# Patient Record
Sex: Male | Born: 1950 | Race: Black or African American | Hispanic: No | Marital: Single | Smoking: Former smoker
Health system: Southern US, Community
[De-identification: ages and names within clinical notes are randomized; demographics above are authoritative.]

## PROBLEM LIST (undated history)

## (undated) DIAGNOSIS — I10 Essential (primary) hypertension: Secondary | ICD-10-CM

## (undated) DIAGNOSIS — B159 Hepatitis A without hepatic coma: Secondary | ICD-10-CM

## (undated) DIAGNOSIS — C801 Malignant (primary) neoplasm, unspecified: Secondary | ICD-10-CM

## (undated) DIAGNOSIS — R519 Headache, unspecified: Secondary | ICD-10-CM

---
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
## 2008-07-26 DIAGNOSIS — I639 Cerebral infarction, unspecified: Secondary | ICD-10-CM

## 2008-07-26 HISTORY — DX: Cerebral infarction, unspecified: I63.9

## 2014-03-20 DIAGNOSIS — I10 Essential (primary) hypertension: Secondary | ICD-10-CM | POA: Insufficient documentation

## 2014-05-08 ENCOUNTER — Emergency Department: Payer: Self-pay | Admitting: Emergency Medicine

## 2014-05-08 LAB — CBC WITH DIFFERENTIAL/PLATELET
Basophil #: 0.1 10*3/uL (ref 0.0–0.1)
Basophil %: 1.3 %
Eosinophil #: 0.1 10*3/uL (ref 0.0–0.7)
Eosinophil %: 1.6 %
HCT: 45.2 % (ref 40.0–52.0)
HGB: 14.7 g/dL (ref 13.0–18.0)
Lymphocyte #: 1.8 10*3/uL (ref 1.0–3.6)
Lymphocyte %: 24.6 %
MCH: 32.1 pg (ref 26.0–34.0)
MCHC: 32.6 g/dL (ref 32.0–36.0)
MCV: 99 fL (ref 80–100)
Monocyte #: 0.5 x10 3/mm (ref 0.2–1.0)
Monocyte %: 6.8 %
Neutrophil #: 4.8 10*3/uL (ref 1.4–6.5)
Neutrophil %: 65.7 %
Platelet: 119 10*3/uL — ABNORMAL LOW (ref 150–440)
RBC: 4.58 10*6/uL (ref 4.40–5.90)
RDW: 14.6 % — ABNORMAL HIGH (ref 11.5–14.5)
WBC: 7.3 10*3/uL (ref 3.8–10.6)

## 2014-05-08 LAB — COMPREHENSIVE METABOLIC PANEL
Albumin: 3.2 g/dL — ABNORMAL LOW (ref 3.4–5.0)
Alkaline Phosphatase: 91 U/L
Anion Gap: 7 (ref 7–16)
BUN: 14 mg/dL (ref 7–18)
Bilirubin,Total: 1.3 mg/dL — ABNORMAL HIGH (ref 0.2–1.0)
Calcium, Total: 8.4 mg/dL — ABNORMAL LOW (ref 8.5–10.1)
Chloride: 104 mmol/L (ref 98–107)
Co2: 25 mmol/L (ref 21–32)
Creatinine: 1.12 mg/dL (ref 0.60–1.30)
EGFR (African American): >60
EGFR (Non-African Amer.): >60
Glucose: 89 mg/dL (ref 65–99)
Osmolality: 272 (ref 275–301)
Potassium: 4 mmol/L (ref 3.5–5.1)
SGOT(AST): 116 U/L — ABNORMAL HIGH (ref 15–37)
SGPT (ALT): 137 U/L — ABNORMAL HIGH
Sodium: 136 mmol/L (ref 136–145)
Total Protein: 8.2 g/dL (ref 6.4–8.2)

## 2014-05-08 LAB — LIPASE, BLOOD: Lipase: 100 U/L (ref 73–393)

## 2014-05-08 LAB — URINALYSIS, COMPLETE
Bacteria: NONE SEEN
Bilirubin,UR: NEGATIVE
Blood: NEGATIVE
Glucose,UR: NEGATIVE mg/dL (ref 0–75)
Ketone: NEGATIVE
Leukocyte Esterase: NEGATIVE
Nitrite: NEGATIVE
Ph: 6 (ref 4.5–8.0)
Protein: NEGATIVE
RBC,UR: NONE SEEN /HPF (ref 0–5)
Specific Gravity: 1.013 (ref 1.003–1.030)
Squamous Epithelial: NONE SEEN
WBC UR: <1 /HPF (ref 0–5)

## 2014-05-08 IMAGING — CT CT ABD-PELV W/ CM
2 of 5 series · 15 of 46 positions shown, 17 images · IV contrast (agent unspecified)
Comparison: None.

CLINICAL DATA: Right lower quadrant pain from yesterday, possible
appendicitis, nausea

EXAM:
CT ABDOMEN AND PELVIS WITH CONTRAST
TECHNIQUE: Multidetector CT imaging of the abdomen and pelvis was performed
using the standard protocol following bolus administration of
intravenous contrast.
CONTRAST:  100 cc [LK]

[Series 2: routine abd pel with · axial · 0.70mm/px · z∈[-932,-527]mm · 12 of 91 slices shown, 14 images]
[im 5/91  soft-tissue]
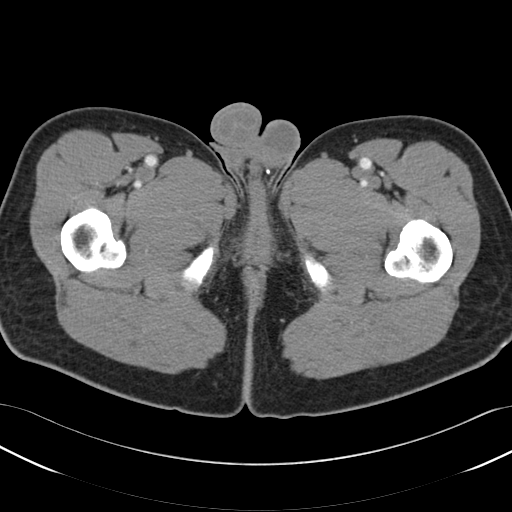
[im 5/91  bone]
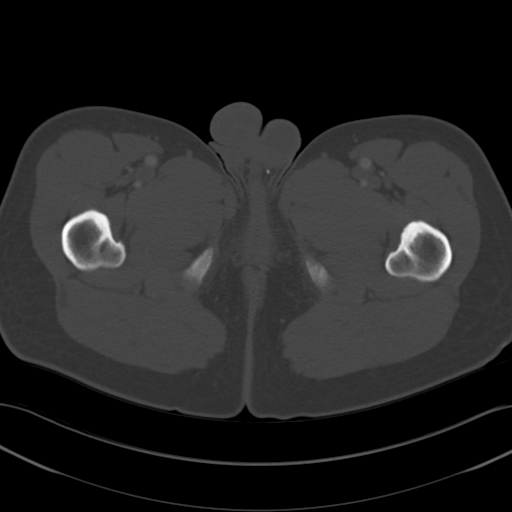
[im 14/91  soft-tissue]
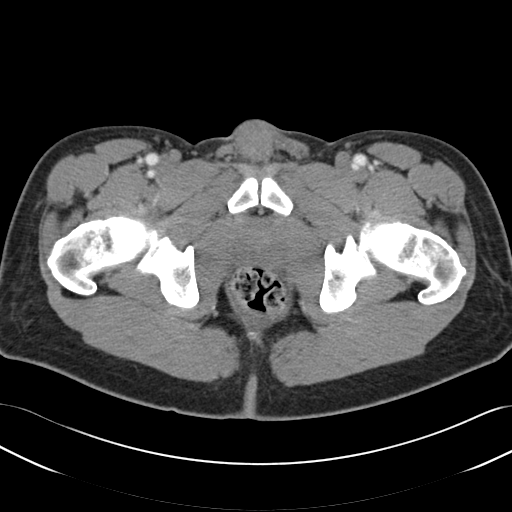
[im 19/91  soft-tissue]
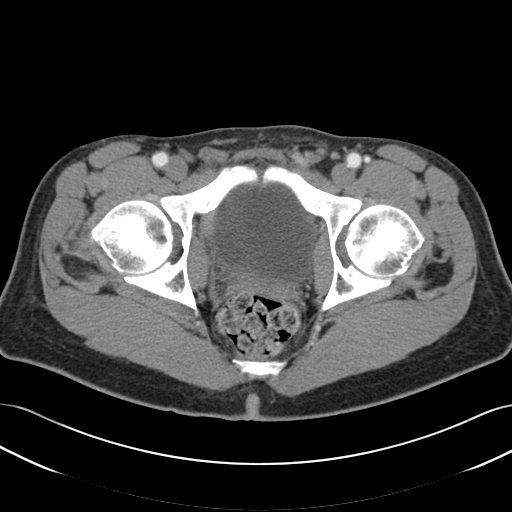
[im 28/91  soft-tissue]
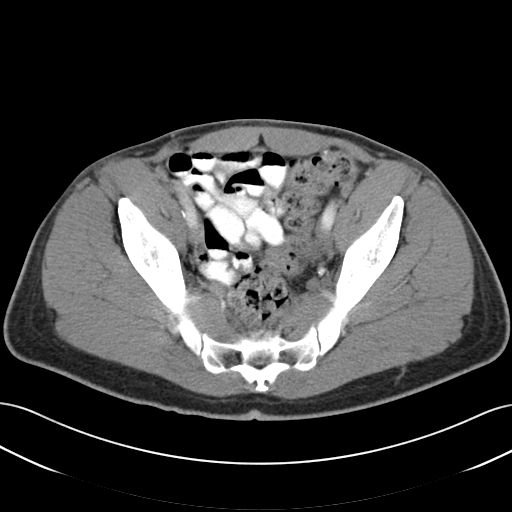
[im 37/91  soft-tissue]
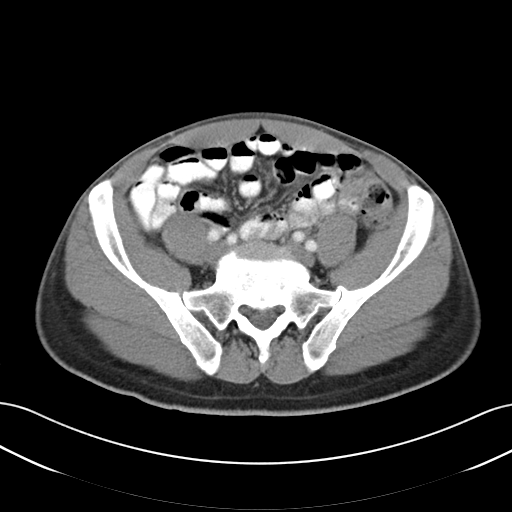
[im 41/91  soft-tissue]
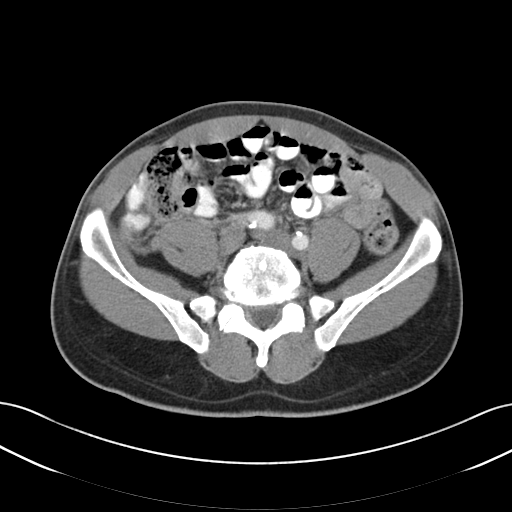
[im 50/91  soft-tissue]
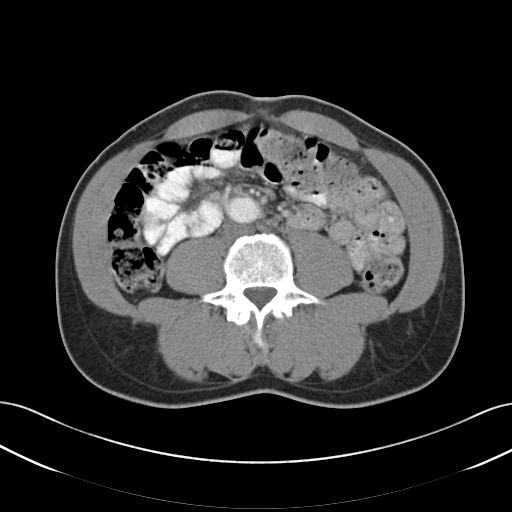
[im 55/91  soft-tissue]
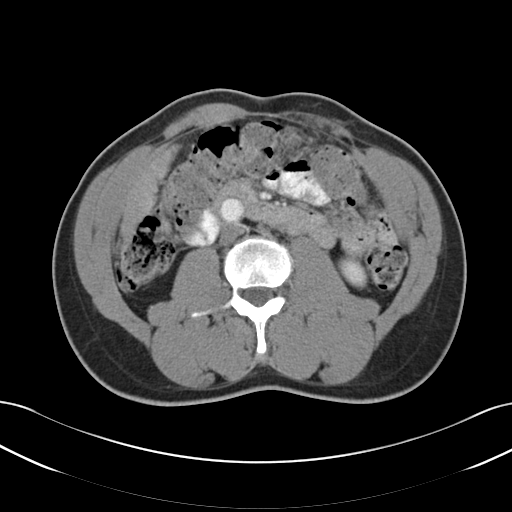
[im 64/91  soft-tissue]
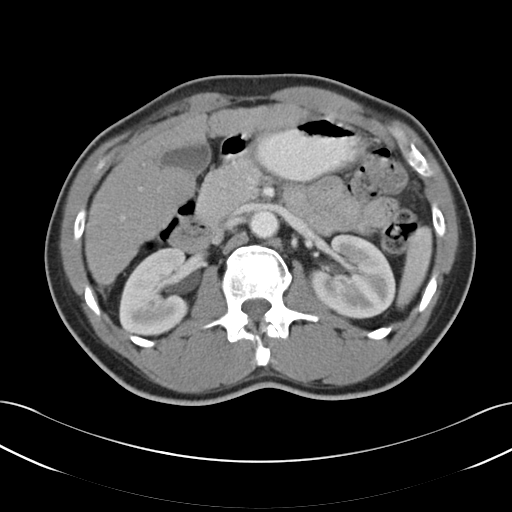
[im 64/91  bone]
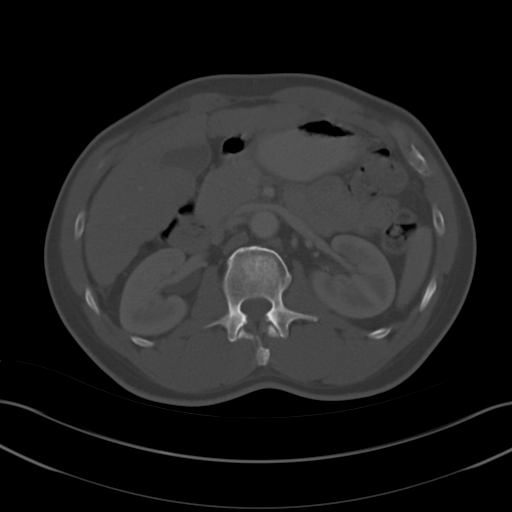
[im 73/91  soft-tissue]
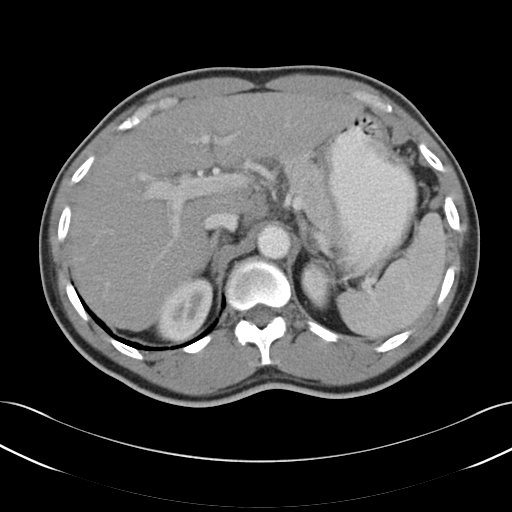
[im 77/91  soft-tissue]
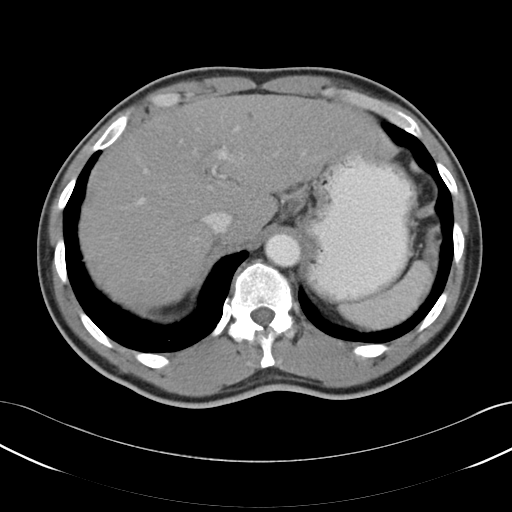
[im 86/91  soft-tissue]
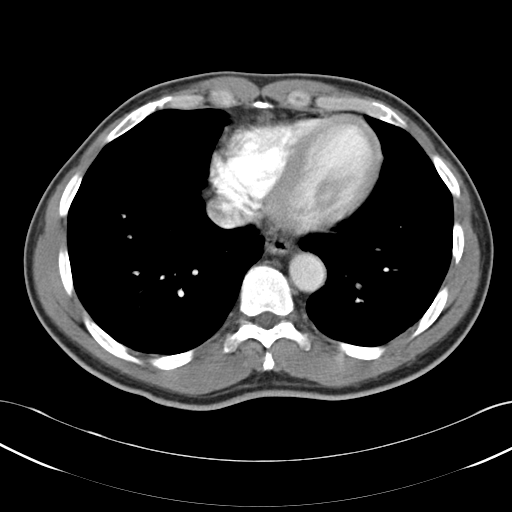

[Series 5: cor routine abd pel with · coronal · 0.59mm/px · 3 of 118 slices shown]
[im 40/118  soft-tissue]
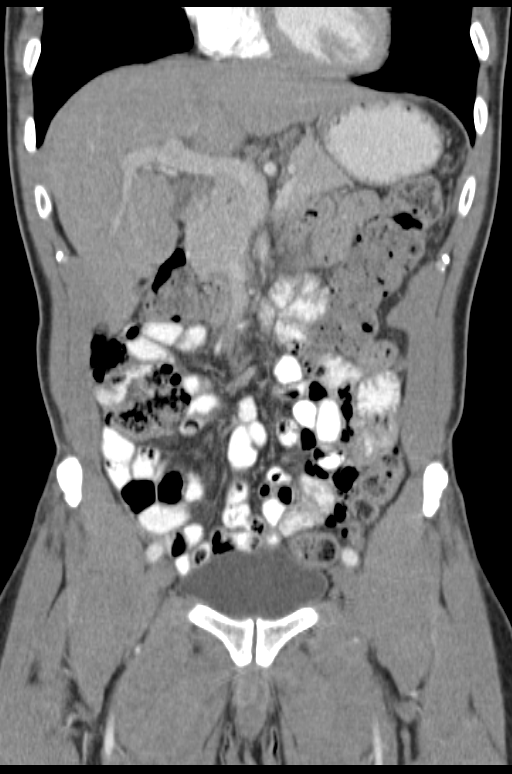
[im 53/118  soft-tissue]
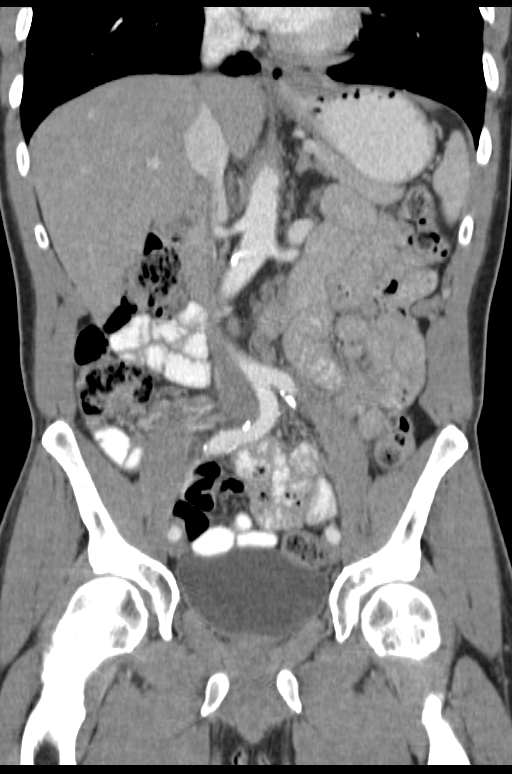
[im 66/118  soft-tissue]
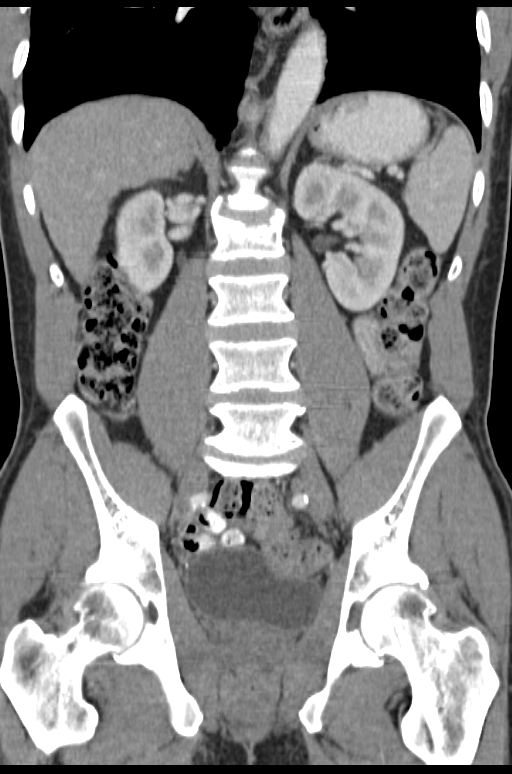

[15 of 46 positions shown; findings below may reference images not displayed]

FINDINGS: Sagittal images shows degenerative changes thoracolumbar spine.
There is Schmorl's node deformity upper endplate of L2 vertebral
body. Lung bases are unremarkable. Enhanced liver shows no focal
mass. No calcified gallstones are noted within gallbladder.
Pancreas, spleen and adrenal glands are unremarkable. Kidneys are
symmetrical in size and enhancement. No hydronephrosis or
hydroureter.

Delayed renal images shows bilateral renal symmetrical excretion. No
aortic aneurysm. Abundant colonic stool. There is no pericecal
inflammation. The appendix is only partially visualized in axial
image 61 measures 4 mm in diameter. The visualized appendix appears
normal.

Abundant stool noted in rectosigmoid colon. Prostate gland and
seminal vesicles are unremarkable. The urinary bladder is
unremarkable. No inguinal adenopathy. No destructive bony lesions
are noted within pelvis.

No small bowel obstruction.  No ascites or free air.  No adenopathy
IMPRESSION: 1. No pericecal inflammation. The appendix is only partially
visualized. The visualized appendix appears normal.
2. No small bowel obstruction.
3. No hydronephrosis or hydroureter.
4. Abundant colonic stool.  No evidence of colonic obstruction.

## 2014-06-17 DIAGNOSIS — B182 Chronic viral hepatitis C: Secondary | ICD-10-CM | POA: Insufficient documentation

## 2014-06-17 DIAGNOSIS — Z720 Tobacco use: Secondary | ICD-10-CM | POA: Insufficient documentation

## 2019-02-19 DIAGNOSIS — F1910 Other psychoactive substance abuse, uncomplicated: Secondary | ICD-10-CM | POA: Insufficient documentation

## 2020-04-01 ENCOUNTER — Emergency Department: Payer: Medicare HMO

## 2020-04-01 ENCOUNTER — Encounter: Payer: Self-pay | Admitting: Emergency Medicine

## 2020-04-01 ENCOUNTER — Other Ambulatory Visit: Payer: Self-pay

## 2020-04-01 ENCOUNTER — Emergency Department
Admission: EM | Admit: 2020-04-01 | Discharge: 2020-04-01 | Disposition: A | Discharge status: Home / Self Care | Payer: Medicare HMO | Attending: Emergency Medicine | Admitting: Emergency Medicine

## 2020-04-01 DIAGNOSIS — R160 Hepatomegaly, not elsewhere classified: Secondary | ICD-10-CM

## 2020-04-01 DIAGNOSIS — K7689 Other specified diseases of liver: Secondary | ICD-10-CM | POA: Insufficient documentation

## 2020-04-01 DIAGNOSIS — R109 Unspecified abdominal pain: Secondary | ICD-10-CM

## 2020-04-01 LAB — URINALYSIS, COMPLETE (UACMP) WITH MICROSCOPIC
Bacteria, UA: NONE SEEN
Bilirubin Urine: NEGATIVE
Glucose, UA: NEGATIVE mg/dL
Hgb urine dipstick: NEGATIVE
Ketones, ur: NEGATIVE mg/dL
Leukocytes,Ua: NEGATIVE
Nitrite: NEGATIVE
Protein, ur: NEGATIVE mg/dL
Specific Gravity, Urine: 1.013 (ref 1.005–1.030)
pH: 7 (ref 5.0–8.0)

## 2020-04-01 LAB — PROTIME-INR
INR: 1.4 — ABNORMAL HIGH (ref 0.8–1.2)
Prothrombin Time: 16.3 seconds — ABNORMAL HIGH (ref 11.4–15.2)

## 2020-04-01 LAB — COMPREHENSIVE METABOLIC PANEL
ALT: 41 U/L (ref 0–44)
AST: 102 U/L — ABNORMAL HIGH (ref 15–41)
Albumin: 2.3 g/dL — ABNORMAL LOW (ref 3.5–5.0)
Alkaline Phosphatase: 129 U/L — ABNORMAL HIGH (ref 38–126)
Anion gap: 4 — ABNORMAL LOW (ref 5–15)
BUN: 11 mg/dL (ref 8–23)
CO2: 28 mmol/L (ref 22–32)
Calcium: 8.2 mg/dL — ABNORMAL LOW (ref 8.9–10.3)
Chloride: 101 mmol/L (ref 98–111)
Creatinine, Ser: 1.02 mg/dL (ref 0.61–1.24)
GFR calc Af Amer: >60 mL/min (ref >60)
GFR calc non Af Amer: >60 mL/min (ref >60)
Glucose, Bld: 90 mg/dL (ref 70–99)
Potassium: 4.3 mmol/L (ref 3.5–5.1)
Sodium: 133 mmol/L — ABNORMAL LOW (ref 135–145)
Total Bilirubin: 2.5 mg/dL — ABNORMAL HIGH (ref 0.3–1.2)
Total Protein: 7.3 g/dL (ref 6.5–8.1)

## 2020-04-01 LAB — CBC
HCT: 37.5 % — ABNORMAL LOW (ref 39.0–52.0)
Hemoglobin: 13.3 g/dL (ref 13.0–17.0)
MCH: 33.8 pg (ref 26.0–34.0)
MCHC: 35.5 g/dL (ref 30.0–36.0)
MCV: 95.2 fL (ref 80.0–100.0)
Platelets: 131 10*3/uL — ABNORMAL LOW (ref 150–400)
RBC: 3.94 MIL/uL — ABNORMAL LOW (ref 4.22–5.81)
RDW: 15.7 % — ABNORMAL HIGH (ref 11.5–15.5)
WBC: 7.3 10*3/uL (ref 4.0–10.5)
nRBC: 0 % (ref 0.0–0.2)

## 2020-04-01 LAB — LIPASE, BLOOD: Lipase: 28 U/L (ref 11–51)

## 2020-04-01 IMAGING — US US ABDOMEN LIMITED
1 series · 13 of 25 positions shown · non-contrast
Comparison: CT abdomen pelvis dated [DATE].

CLINICAL DATA: Epigastric pain for the past 5 days.

EXAM:
ULTRASOUND ABDOMEN LIMITED RIGHT UPPER QUADRANT

[Series 1: us abdomen limited ruq · 13 of 83 slices shown]
[im 1/83]
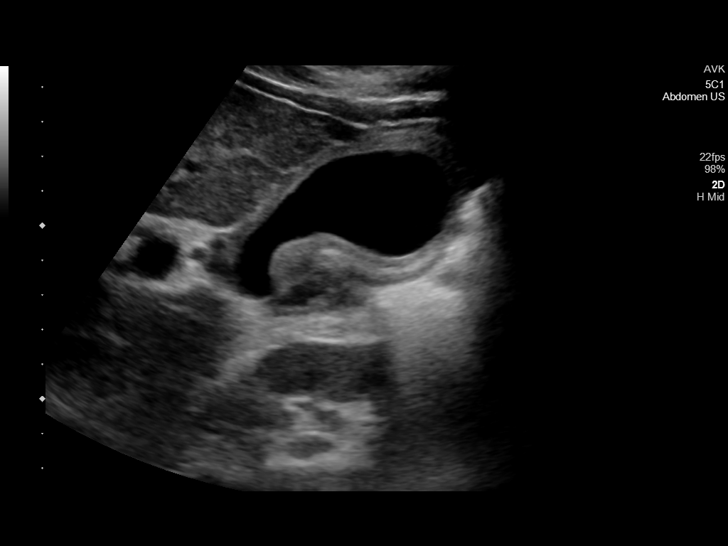
[im 7/83]
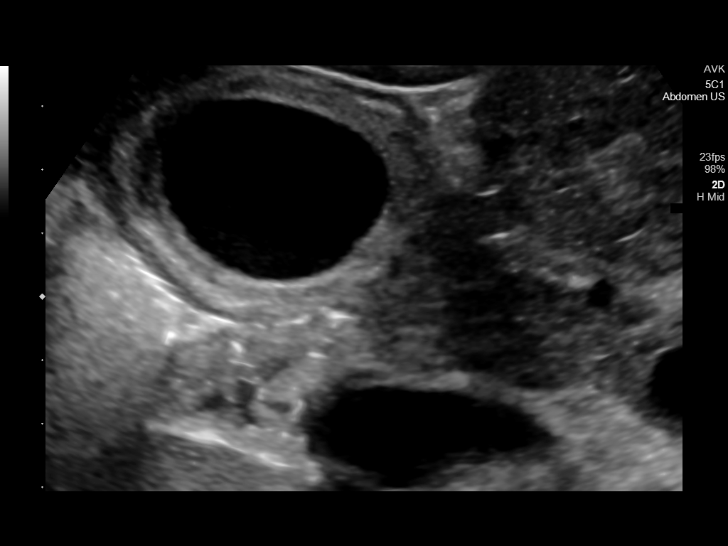
[im 14/83]
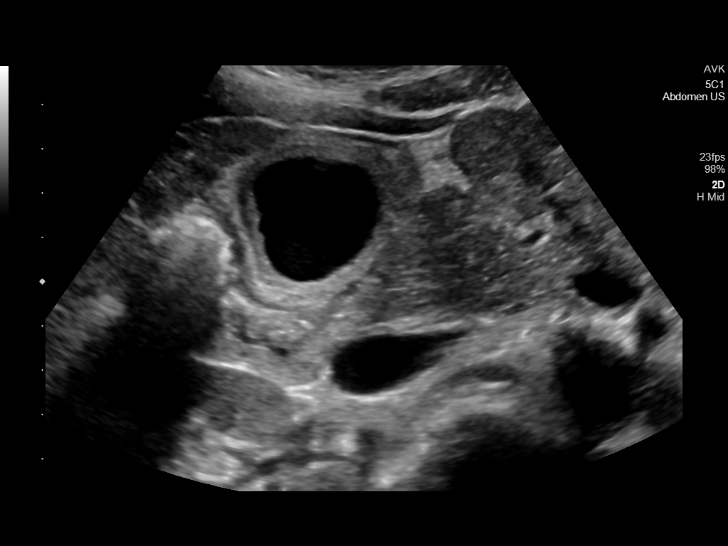
[im 21/83]
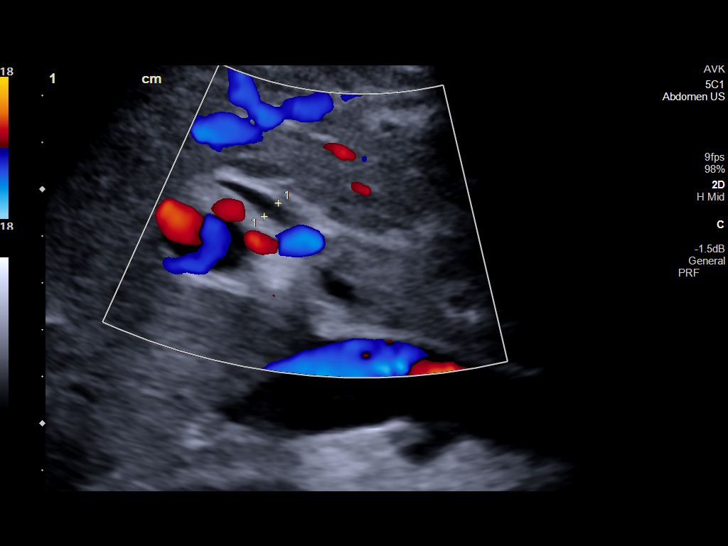
[im 28/83]
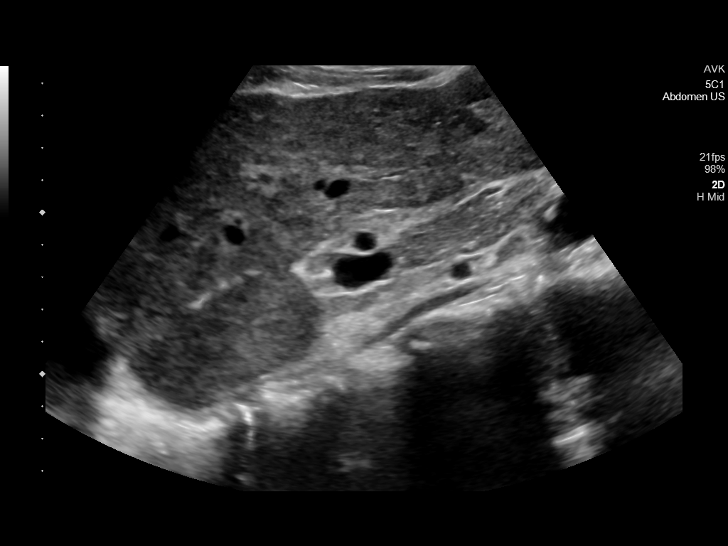
[im 35/83]
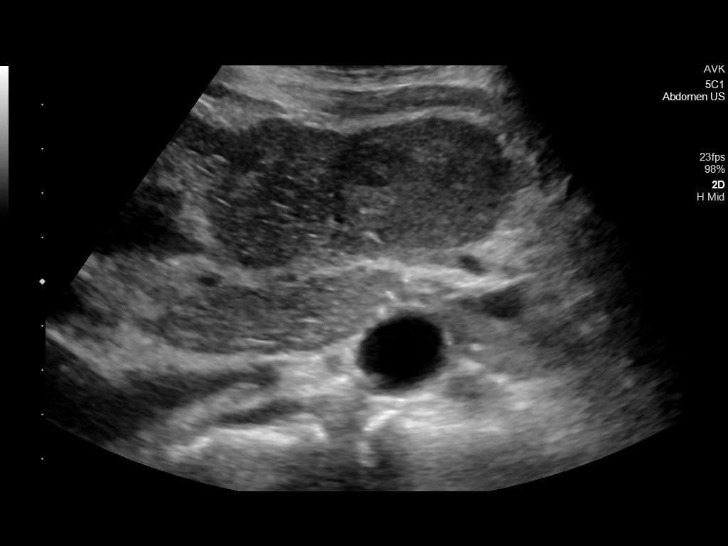
[im 42/83]
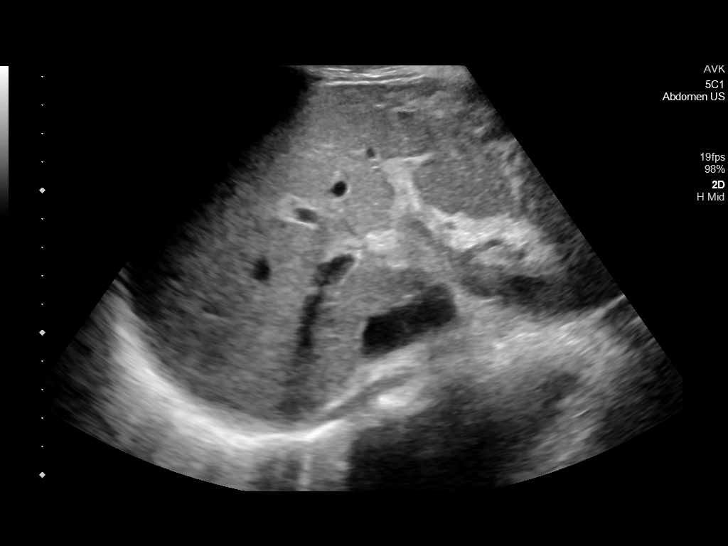
[im 48/83]
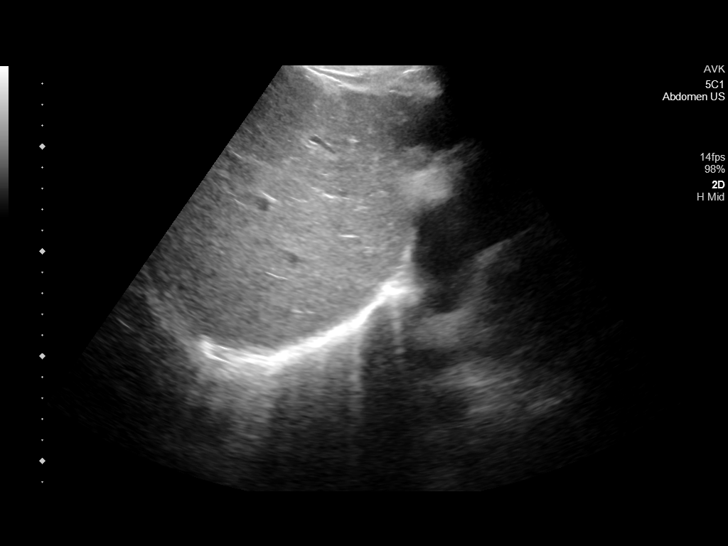
[im 55/83]
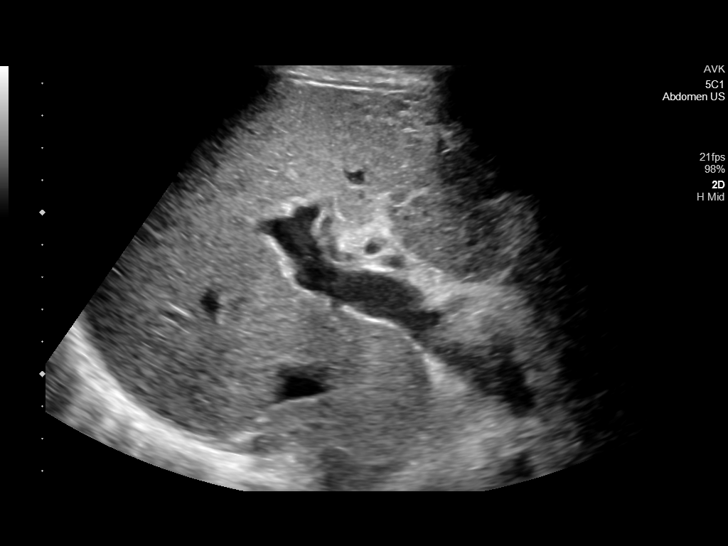
[im 62/83]
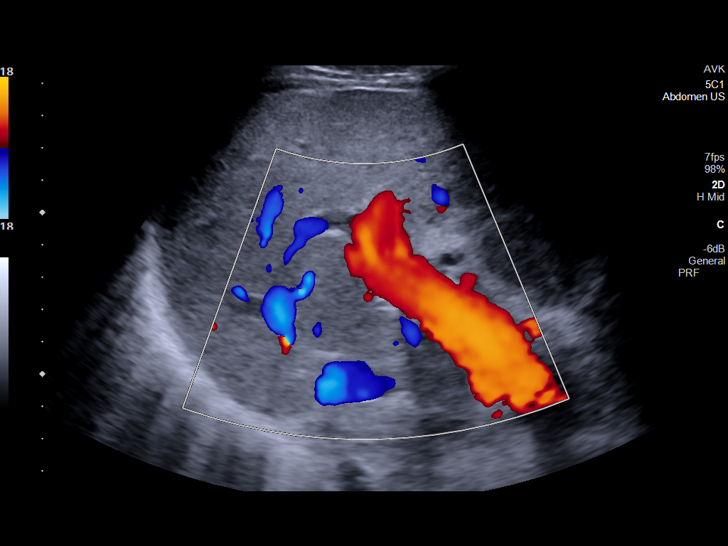
[im 69/83]
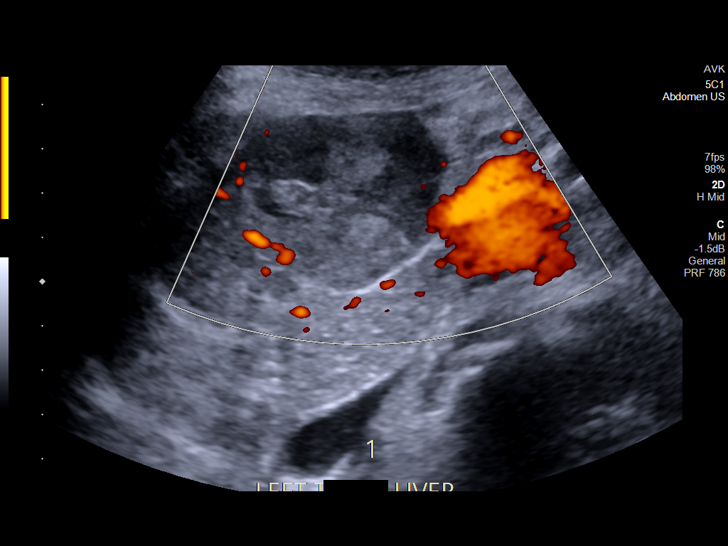
[im 76/83]
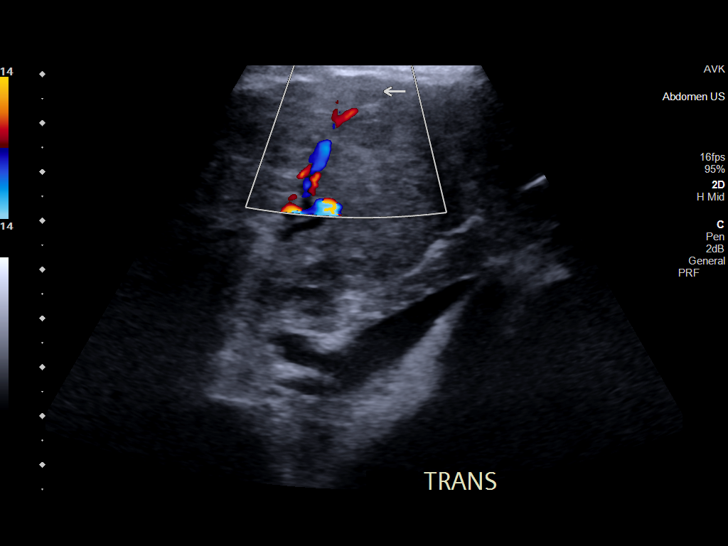
[im 83/83]
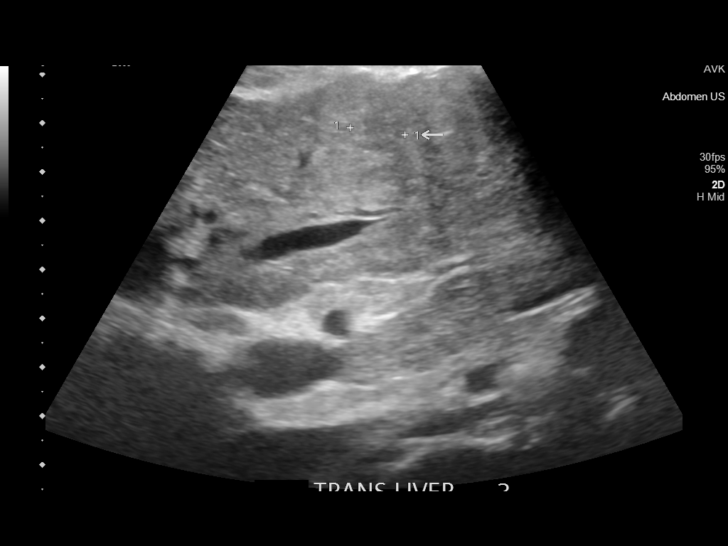

[13 of 25 positions shown; findings below may reference images not displayed]

FINDINGS: Gallbladder:

Small amount of sludge with prominent asymmetric wall thickening. No
gallstones. No sonographic Murphy sign noted by sonographer.

Common bile duct:

Diameter: 4 mm, normal.

Liver:

Nodular contour with coarsened, heterogeneously increased
parenchymal echogenicity. There are three focal lesions identified
in the left hepatic lobe, all containing internal vascularity. The
largest measures 4.8 x 4.8 x 4.2 cm. The other two lesions measure
0.9 x 0.9 x 0.8 cm and 1.1 x 1.1 x 0.9 cm. Portal vein is patent on
color Doppler imaging with normal direction of blood flow towards
the liver.

Other: None.
IMPRESSION: 1. Cirrhosis with three new lesions in the left hepatic lobe
measuring up to 4.8 cm, suspicious for hepatocellular carcinoma.
When the patient is clinically stable and able to follow directions
and hold their breath (preferably as an outpatient) further
evaluation with dedicated liver protocol MRI with and without
contrast is recommended.
2. Small amount of gallbladder sludge with prominent asymmetric wall
thickening. As there is no other evidence of cholecystitis, wall
thickening is favored related to underlying liver disease. If there
is strong clinical concern for cholecystitis, consider HIDA scan for
further evaluation.

## 2020-04-01 IMAGING — CT CT ABD-PEL WO/W CM
3 of 15 series · 11 of 46 positions shown, 17 images · IV contrast (APPLIED)
Comparison: [DATE].

CLINICAL DATA: Right upper quadrant pain.

EXAM:
CT ABDOMEN AND PELVIS WITHOUT AND WITH CONTRAST
TECHNIQUE: Multidetector CT imaging of the abdomen and pelvis was performed
following the standard protocol before and following the bolus
administration of intravenous contrast.
CONTRAST:  100mL OMNIPAQUE IOHEXOL 300 MG/ML  SOLN

[Series 4: coronal pre · coronal · non-contrast · 0.43mm/px · 1 of 79 slices shown, 2 images]
[im 40/79  soft-tissue]
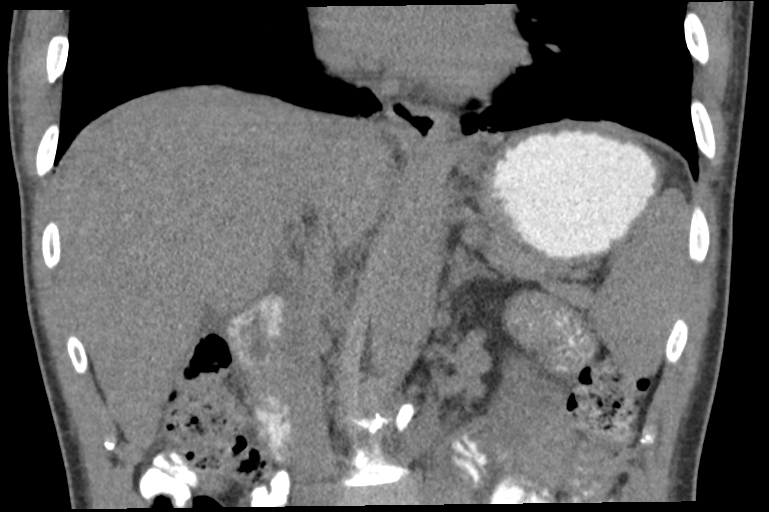
[im 40/79  bone]
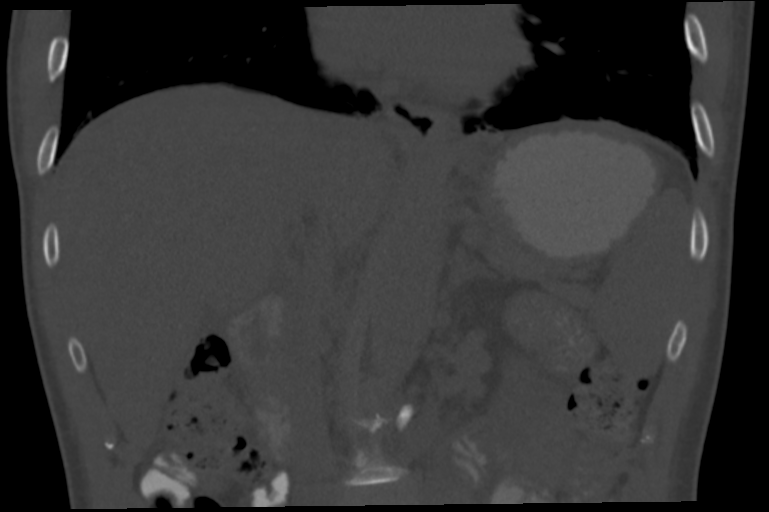

[Series 6: axial arterial · axial · arterial · 0.62mm/px · z∈[-892,-712]mm · 4 of 160 slices shown]
[im 20/160  soft-tissue]
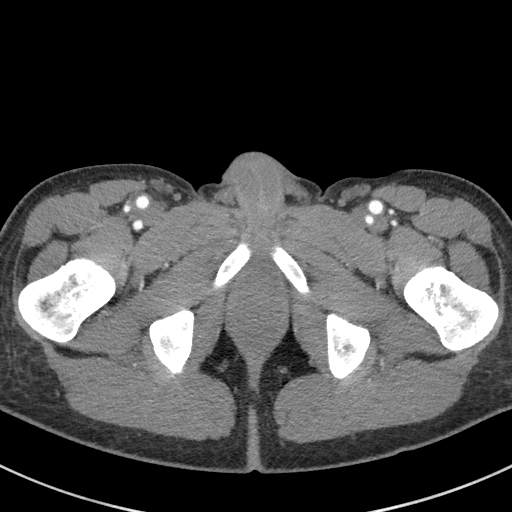
[im 40/160  soft-tissue]
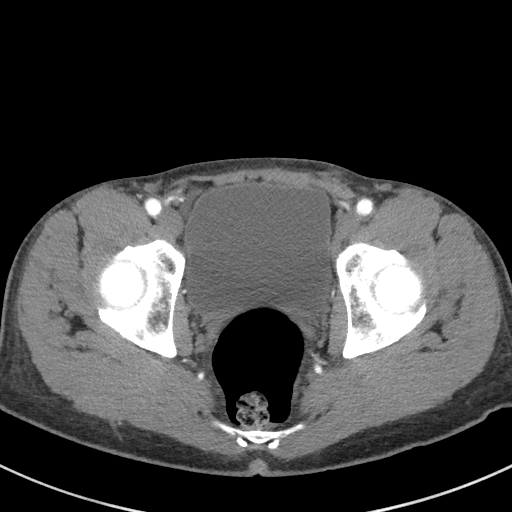
[im 60/160  soft-tissue]
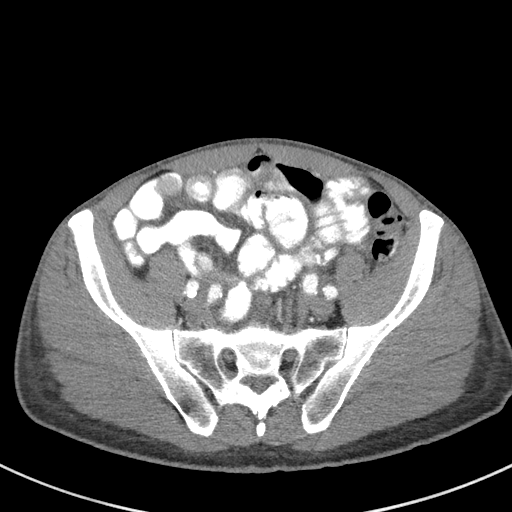
[im 80/160  soft-tissue]
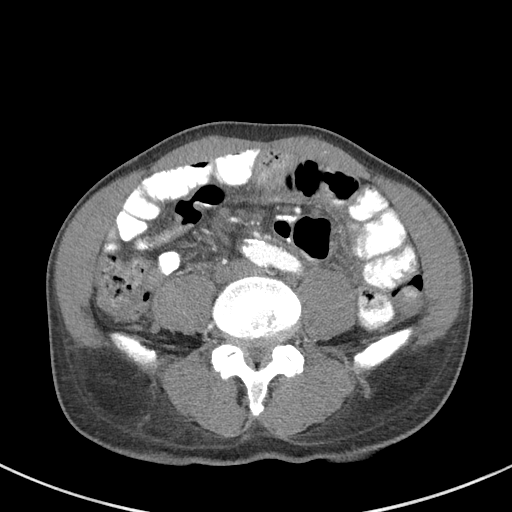

[Series 11: axial venous · axial · portal-venous · 0.63mm/px · z∈[-883,-541]mm · 6 of 160 slices shown, 11 images]
[im 23/160  soft-tissue]
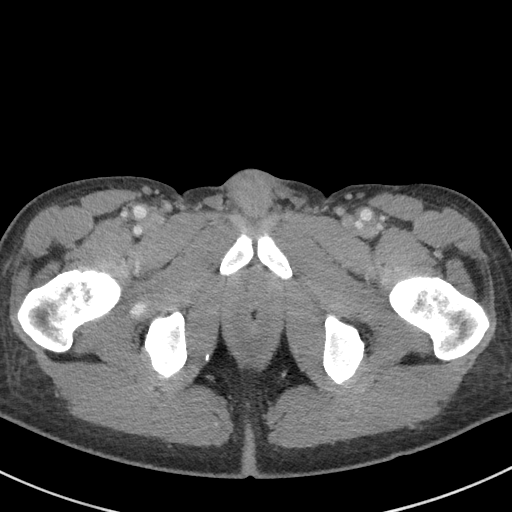
[im 23/160  bone]
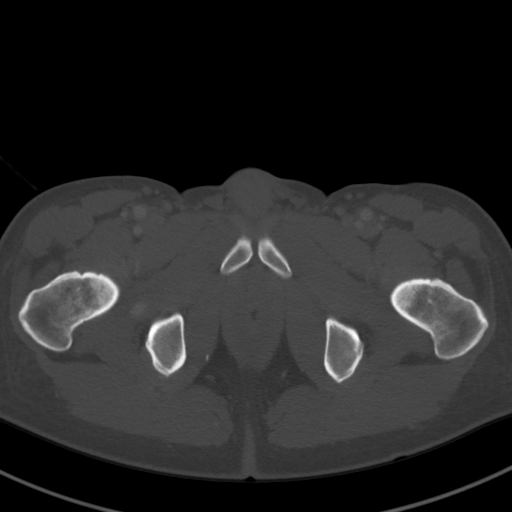
[im 46/160  soft-tissue]
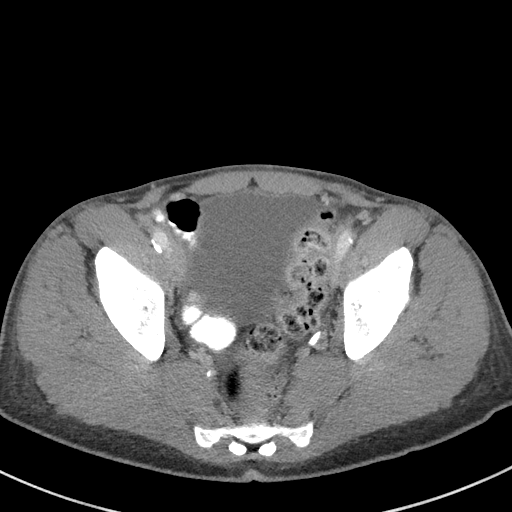
[im 69/160  soft-tissue]
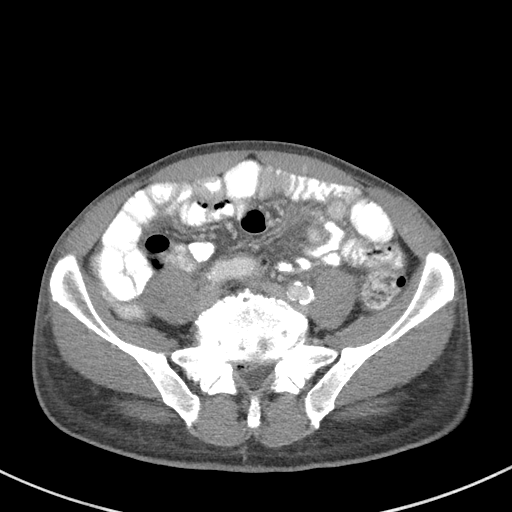
[im 69/160  lung]
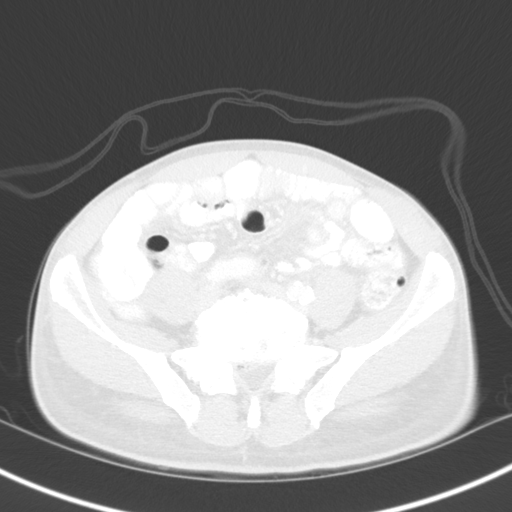
[im 91/160  soft-tissue]
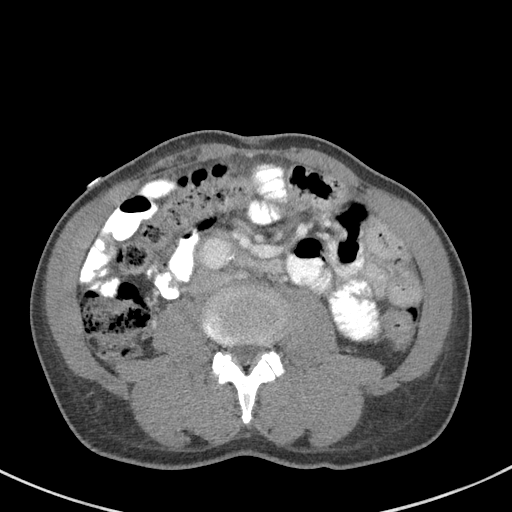
[im 91/160  lung]
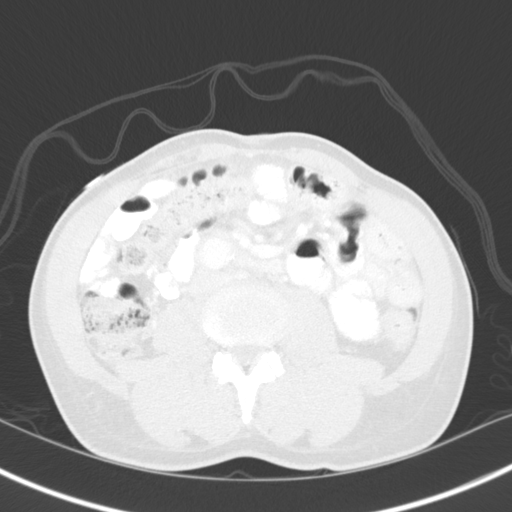
[im 114/160  soft-tissue]
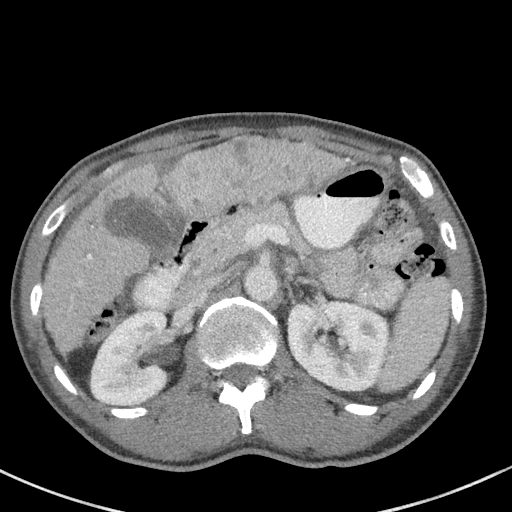
[im 114/160  lung]
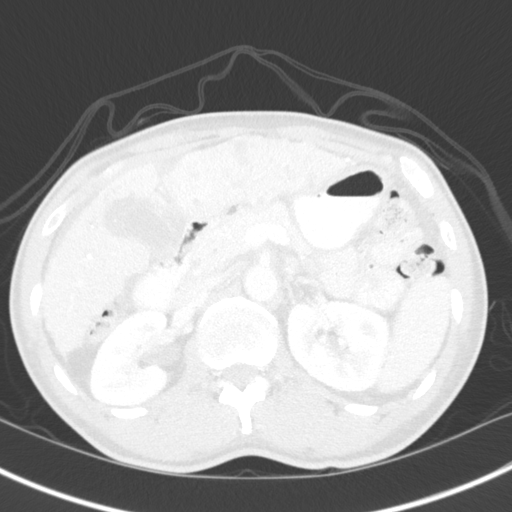
[im 137/160  soft-tissue]
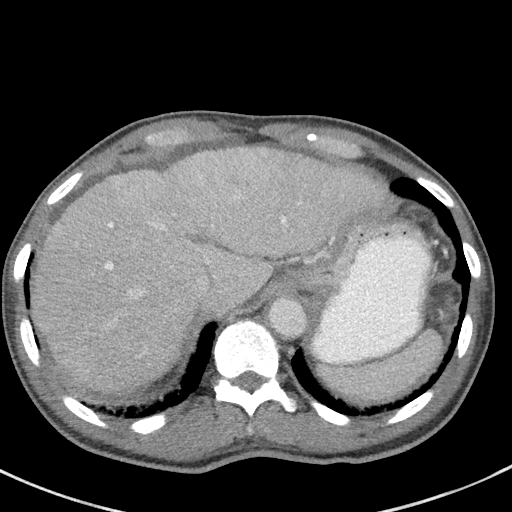
[im 137/160  lung]
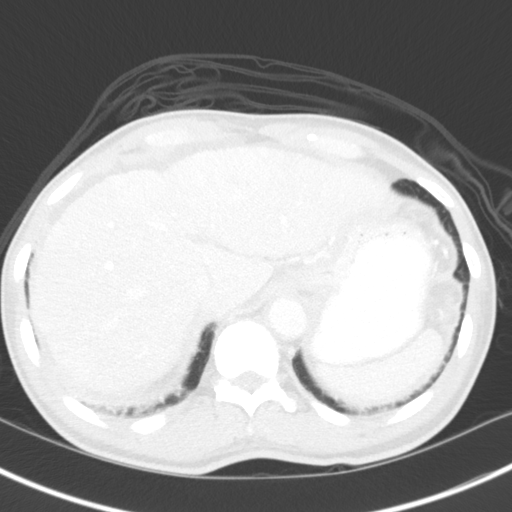

[11 of 46 positions shown; findings below may reference images not displayed]

FINDINGS: Lower chest: Unremarkable.

Hepatobiliary: Nodular liver contour is compatible with cirrhosis.
Multiple lesions within the parenchyma show arterial phase
hyperenhancement, including 2.1 cm lesion in the dome of the liver
on [DATE] and 9 mm lesion in the posterior right liver on [DATE].
Heterogeneous arterial phase hyperenhancement in the lateral segment
left liver inferiorly measures 9.4 x 4.0 cm. Probable 13 mm cyst in
the lateral segment left liver. 2.1 cm low-density lesion posterior
right liver may be a cyst. Mild periportal edema. Gallbladder is
distended with gallbladder wall edema and pericholecystic fluid. No
substantial intra or extrahepatic biliary duct dilatation.

Pancreas: No focal mass lesion. No dilatation of the main duct. No
intraparenchymal cyst. No peripancreatic edema.

Spleen: No splenomegaly. No focal mass lesion.

Adrenals/Urinary Tract: No adrenal nodule or mass. Small
hypoattenuating lesion posterior right kidney is too small to
characterize but likely benign. Left kidney unremarkable. No
evidence for hydroureter. The urinary bladder appears normal for the
degree of distention.

Stomach/Bowel: Stomach is unremarkable. No gastric wall thickening.
No evidence of outlet obstruction. Duodenum is normally positioned
as is the ligament of Treitz. No small bowel wall thickening. No
small bowel dilatation. The terminal ileum is normal. The appendix
is normal. No gross colonic mass. No colonic wall thickening.

Vascular/Lymphatic: There is abdominal aortic atherosclerosis
without aneurysm. Portal vein, superior mesenteric vein, and splenic
vein are patent. Upper normal lymph nodes identified in the
hepatoduodenal ligament. 11 mm short axis left para-aortic node on
62/11 is similar to prior suggesting reactive etiology. No pelvic
sidewall lymphadenopathy.

Reproductive: The prostate gland and seminal vesicles are
unremarkable.

Other: Small volume free fluid seen in the pelvis an adjacent to the
liver.

Musculoskeletal: No worrisome lytic or sclerotic osseous
abnormality. Superior endplate compression deformity noted at L2
stable since prior.
IMPRESSION: 1. Cirrhotic changes in the liver with multiple lesions showing
arterial phase hyperenhancement. This includes a heterogeneous 9.4 x
4.0 cm in the lateral segment left liver. Imaging features are
highly concerning for multifocal hepatocellular carcinoma, including
a probable infiltrative lesion in the left liver. MRI of the abdomen
without and with contrast recommended to further evaluate.
2. Distended gallbladder with gallbladder wall edema and
pericholecystic fluid. Imaging features may be related to underlying
systemic/liver disease.
3. Small volume free fluid in the pelvis and adjacent to the liver.
4. Aortic Atherosclerosis ([3Q]-[3Q]).

## 2020-04-01 MED ORDER — SODIUM CHLORIDE 0.9 % IV BOLUS
1000.0000 mL | Freq: Once | INTRAVENOUS | Status: AC
  Administered 2020-04-01: 1000 mL via INTRAVENOUS

## 2020-04-01 MED ORDER — IOHEXOL 300 MG/ML  SOLN
100.0000 mL | Freq: Once | INTRAMUSCULAR | Status: AC | PRN
  Administered 2020-04-01: 100 mL via INTRAVENOUS

## 2020-04-01 MED ORDER — OXYCODONE HCL 5 MG PO TABS
5.0000 mg | ORAL_TABLET | Freq: Four times a day (QID) | ORAL | 0 refills | Status: DC | PRN
Start: 2020-04-01 — End: 2020-04-04

## 2020-04-01 MED ORDER — MORPHINE SULFATE (PF) 4 MG/ML IV SOLN
4.0000 mg | Freq: Once | INTRAVENOUS | Status: AC
  Administered 2020-04-01: 4 mg via INTRAVENOUS
  Filled 2020-04-01: qty 1

## 2020-04-01 MED ORDER — IOHEXOL 9 MG/ML PO SOLN: 500.0000 mL | Freq: Once | ORAL | Status: DC | PRN

## 2020-04-01 NOTE — ED Notes (Signed)
Pt signed paper copy of d/c

## 2020-04-01 NOTE — ED Triage Notes (Signed)
Patient presents to the ED via EMS for right upper quadrant pain x 5 days.  Patient states pain is worse today.  Patient has hepatitis A and states pain is worse with movement.

## 2020-04-01 NOTE — Discharge Instructions (Signed)
No driving today or while taking oxycodone.  Follow-up with your primary doctor as well as gastroenterology and oncology as soon as possible for concerns you have liver cancer.

## 2020-04-01 NOTE — ED Provider Notes (Signed)
[facility] Emergency Department Provider Note   ____________________________________________   First MD Initiated Contact with Patient 04/01/20 1147     (approximate)  I have reviewed the triage vital signs and the nursing notes.   HISTORY  Chief Complaint Abdominal Pain    HPI Joshua Ramos is a 69 y.o. male who reports a previous history of "hepatitis" though he is not sure what type it is but reports he has had it for over a year.  Does have a remote history of polysubstance abuse.  Patient reports that for about 5 days a week now has been having pain in his right upper abdomen.  He also reports that he has been losing a lot of weight he has lost probably 30 pounds in the last few months without intent.  He feels fatigue.  No chest pain no trouble breathing.  No vomiting or nausea.  He has generally decreased appetite.  No loose stools or diarrhea.  No black or bloody emesis.  No fevers or chills.  Denies Covid exposure     Past Medical History:  Diagnosis Date  . Hepatitis A     There are no problems to display for this patient.   History reviewed. No pertinent surgical history.  Prior to Admission medications   Medication Sig Start Date End Date Taking? Authorizing Provider  oxyCODONE (OXY IR/ROXICODONE) 5 MG immediate release tablet Take 1 tablet (5 mg total) by mouth every 6 (six) hours as needed for severe pain. 04/01/20   Delman Kitten, MD    Allergies Patient has no known allergies.  No family history on file.  Social History Social History   Tobacco Use  . Smoking status: Never Smoker  Substance Use Topics  . Alcohol use: Not on file  . Drug use: Not on file  Remote history of drug use, most recently was smokes marijuana last about a month ago but previous substance abuse history in the past with other drugs as well Denies alcohol use.  Denies previous history of heavy alcohol use   Review of Systems Constitutional:  No fever/chills but feeling a bit fatigued losing weight over the last couple months Eyes: No visual changes. ENT: No sore throat. Cardiovascular: Denies chest pain. Respiratory: Denies shortness of breath. Gastrointestinal: See HPI.  Pain located in the mid right to right upper abdomen.  Seems to be slowly worsening daily for the last 5 days to a week.  No lower abdominal pain.  No left side abdominal pain. Genitourinary: Negative for dysuria. Musculoskeletal: Negative for back pain. Skin: Negative for rash. Neurological: Negative for headaches, areas of focal weakness or numbness.    ____________________________________________   PHYSICAL EXAM:  VITAL SIGNS: ED Triage Vitals  Enc Vitals Group     BP 04/01/20 1017 (!) 169/91     Pulse Rate 04/01/20 1017 76     Resp 04/01/20 1017 18     Temp 04/01/20 1017 98.2 F (36.8 C)     Temp Source 04/01/20 1017 Oral     SpO2 04/01/20 1017 99 %     Weight --      Height --      Head Circumference --      Peak Flow --      Pain Score 04/01/20 1030 0     Pain Loc --      Pain Edu? --      Excl. in [place]? --     Constitutional: Alert and oriented. Well  appearing and in no acute distress. Eyes: Conjunctivae are slightly injected, slightly jaundiced. Head: Atraumatic. Nose: No congestion/rhinnorhea. Mouth/Throat: Mucous membranes are moist. Neck: No stridor.  Cardiovascular: Normal rate, regular rhythm. Grossly normal heart sounds.  Good peripheral circulation. Respiratory: Normal respiratory effort.  No retractions. Lungs CTAB. Gastrointestinal: Soft and moderate tenderness quite focally in the right upper quadrant with some voluntary guarding in that region.  No left-sided abdominal pain.  No right lower quadrant abdominal pain.. No distention.  No noted umbilical hernias.  No anterior abdominal hernias. Musculoskeletal: No lower extremity tenderness nor edema. Neurologic:  Normal speech and language. No gross focal neurologic deficits  are appreciated.  Skin:  Skin is warm, dry and intact. No rash noted. Psychiatric: Mood and affect are normal. Speech and behavior are normal.  ____________________________________________   LABS (all labs ordered are listed, but only abnormal results are displayed)  Labs Reviewed  COMPREHENSIVE METABOLIC PANEL - Abnormal; Notable for the following components:      Result Value   Sodium 133 (*)    Calcium 8.2 (*)    Albumin 2.3 (*)    AST 102 (*)    Alkaline Phosphatase 129 (*)    Total Bilirubin 2.5 (*)    Anion gap 4 (*)    All other components within normal limits  CBC - Abnormal; Notable for the following components:   RBC 3.94 (*)    HCT 37.5 (*)    RDW 15.7 (*)    Platelets 131 (*)    All other components within normal limits  URINALYSIS, COMPLETE (UACMP) WITH MICROSCOPIC - Abnormal; Notable for the following components:   Color, Urine YELLOW (*)    APPearance CLEAR (*)    All other components within normal limits  PROTIME-INR - Abnormal; Notable for the following components:   Prothrombin Time 16.3 (*)    INR 1.4 (*)    All other components within normal limits  LIPASE, BLOOD   ____________________________________________  EKG  ED ECG REPORT I, Delman Kitten, the attending physician, personally viewed and interpreted this ECG.  Date: 04/01/2020 EKG Time: 1030 Rate: 70 Rhythm: normal sinus rhythm QRS Axis: Probable left ventricular hypertrophy Intervals: normal ST/T Wave abnormalities: normal Narrative Interpretation: no evidence of acute ischemia, consistent with LVH  ____________________________________________  RADIOLOGY  CT ABDOMEN PELVIS W WO CONTRAST  Result Date: 04/01/2020 CLINICAL DATA:  Right upper quadrant pain. EXAM: CT ABDOMEN AND PELVIS WITHOUT AND WITH CONTRAST TECHNIQUE: Multidetector CT imaging of the abdomen and pelvis was performed following the standard protocol before and following the bolus administration of intravenous contrast.  CONTRAST:  186mL OMNIPAQUE IOHEXOL 300 MG/ML  SOLN COMPARISON:  05/08/2016. FINDINGS: Lower chest: Unremarkable. Hepatobiliary: Nodular liver contour is compatible with cirrhosis. Multiple lesions within the parenchyma show arterial phase hyperenhancement, including 2.1 cm lesion in the dome of the liver on 19/6 and 9 mm lesion in the posterior right liver on 28/6. Heterogeneous arterial phase hyperenhancement in the lateral segment left liver inferiorly measures 9.4 x 4.0 cm. Probable 13 mm cyst in the lateral segment left liver. 2.1 cm low-density lesion posterior right liver may be a cyst. Mild periportal edema. Gallbladder is distended with gallbladder wall edema and pericholecystic fluid. No substantial intra or extrahepatic biliary duct dilatation. Pancreas: No focal mass lesion. No dilatation of the main duct. No intraparenchymal cyst. No peripancreatic edema. Spleen: No splenomegaly. No focal mass lesion. Adrenals/Urinary Tract: No adrenal nodule or mass. Small hypoattenuating lesion posterior right kidney is too small to  characterize but likely benign. Left kidney unremarkable. No evidence for hydroureter. The urinary bladder appears normal for the degree of distention. Stomach/Bowel: Stomach is unremarkable. No gastric wall thickening. No evidence of outlet obstruction. Duodenum is normally positioned as is the ligament of Treitz. No small bowel wall thickening. No small bowel dilatation. The terminal ileum is normal. The appendix is normal. No gross colonic mass. No colonic wall thickening. Vascular/Lymphatic: There is abdominal aortic atherosclerosis without aneurysm. Portal vein, superior mesenteric vein, and splenic vein are patent. Upper normal lymph nodes identified in the hepatoduodenal ligament. 11 mm short axis left para-aortic node on 62/11 is similar to prior suggesting reactive etiology. No pelvic sidewall lymphadenopathy. Reproductive: The prostate gland and seminal vesicles are unremarkable.  Other: Small volume free fluid seen in the pelvis an adjacent to the liver. Musculoskeletal: No worrisome lytic or sclerotic osseous abnormality. Superior endplate compression deformity noted at L2 stable since prior. IMPRESSION: 1. 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 This includes a heterogeneous 9.4 x 4.0 cm in the lateral segment left liver. Imaging features are highly concerning for multifocal hepatocellular carcinoma, including a probable infiltrative lesion in the left liver. MRI of the abdomen without and with contrast recommended to further evaluate. 2. Distended gallbladder with gallbladder wall edema and pericholecystic fluid. Imaging features may be related to underlying systemic/liver disease. 3. Small volume free fluid in the pelvis and adjacent to the liver. 4. Aortic Atherosclerosis (ICD10-I70.0). Electronically Signed   By: Misty Stanley M.D.   On: 04/01/2020 15:37   US ABDOMEN LIMITED RUQ  Result Date: 04/01/2020 CLINICAL DATA:  Epigastric pain for the past 5 days. EXAM: ULTRASOUND ABDOMEN LIMITED RIGHT UPPER QUADRANT COMPARISON:  CT abdomen pelvis dated May 08, 2014. FINDINGS: Gallbladder: Small amount of sludge with prominent asymmetric wall thickening. No gallstones. No sonographic Murphy sign noted by sonographer. Common bile duct: Diameter: 4 mm, normal. Liver: Nodular contour with coarsened, heterogeneously increased parenchymal echogenicity. There are three focal lesions identified in the left hepatic lobe, all containing internal vascularity. The largest measures 4.8 x 4.8 x 4.2 cm. The other two lesions measure 0.9 x 0.9 x 0.8 cm and 1.1 x 1.1 x 0.9 cm. Portal vein is patent on color Doppler imaging with normal direction of blood flow towards the liver. Other: None. IMPRESSION: 1. Cirrhosis with three new lesions in the left hepatic lobe measuring up to 4.8 cm, suspicious for hepatocellular carcinoma. When the patient is  clinically stable and able to follow directions and hold their breath (preferably as an outpatient) further evaluation with dedicated liver protocol MRI with and without contrast is recommended. 2. Small amount of gallbladder sludge with prominent asymmetric wall thickening. As there is no other evidence of cholecystitis, wall thickening is favored related to underlying liver disease. If there is strong clinical concern for cholecystitis, consider HIDA scan for further evaluation. Electronically Signed   By: Titus Dubin M.D.   On: 04/01/2020 12:46    CT imaging as well as ultrasound imaging is been reviewed with Dr. 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  Recommends close outpatient follow-up with both her clinic as well as oncology ____________________________________________   PROCEDURES  Procedure(s) performed: None  Procedures  Critical Care performed: No  ____________________________________________   INITIAL IMPRESSION / ASSESSMENT AND PLAN / ED COURSE  Pertinent labs & imaging results that were available during my care of the patient were reviewed by me and considered in my medical decision  making (see chart for details).   Differential diagnosis includes but is not limited to, abdominal perforation, aortic dissection, cholecystitis, appendicitis, diverticulitis, colitis, esophagitis/gastritis, kidney stone, pyelonephritis, urinary tract infection, aortic aneurysm. All are considered in decision and treatment plan. Based upon the patient's presentation and risk factors, and clinical evaluation appears to have focal right upper quadrant pain.  He does have mild transaminitis, mild hyperbilirubinemia.  He has focal pain in the right upper quadrant.  Also reports significant weight loss.  Concerned this could be a subacute process such as a malignancy, or indolent infection, hepatitis, cirrhosis, or other additional intra-abdominal pathology.  He is  hemodynamically stable.  Will evaluate focally with right upper quadrant ultrasound, based on this result potentially proceed to CT imaging based on results.  Pain control, hydration.  Vance Hochmuth was evaluated in Emergency Department on 04/01/2020 for the symptoms described in the history of present illness. He was evaluated in the context of the global COVID-19 pandemic, which necessitated consideration that the patient might be at risk for infection with the SARS-CoV-2 virus that causes COVID-19. Institutional protocols and algorithms that pertain to the evaluation of patients at risk for COVID-19 are in a state of rapid change based on information released by regulatory bodies including the CDC and federal and state organizations. These policies and algorithms were followed during the patient's care in the ED.     Clinical Course as of Apr 02 1607  Tue Apr 01, 2020  1411 Case discussed with Dr. Marius Ditch, CT Liver Mass ordered per her recommendations   [MQ]    Clinical Course User Index [MQ] Delman Kitten, MD   ----------------------------------------- 4:07 PM on 04/01/2020 -----------------------------------------  Patient comfortable with plan for prescription for oxycodone. I will prescribe the patient a narcotic pain medicine due to their condition which I anticipate will cause at least moderate pain short term. I discussed with the patient safe use of narcotic pain medicines, and that they are not to drive, work in dangerous areas, or ever take more than prescribed (no more than 1 pill every 6 hours). We discussed that this is the type of medication that can be  overdosed on and the risks of this type of medicine. Patient is very agreeable to only use as prescribed and to never use more than prescribed.  He will discontinue his Tylenol codeine previously prescribed.  He will be following up and is agreeable to follow-up closely with gastroenterology, oncology, and will also notify his  primary doctor for follow-up and assistance in navigating concern for liver cancer.  Patient comfortable with plan for discharge.  He is not driving himself.  Referral to gastroenterology made  Return precautions and treatment recommendations and follow-up discussed with the patient who is agreeable with the plan.   ____________________________________________   FINAL CLINICAL IMPRESSION(S) / ED DIAGNOSES  Final diagnoses:  Abdominal pain  Mass of right lobe of liver        Note:  This document was prepared using Dragon voice recognition software and may include unintentional dictation errors       Delman Kitten, MD 04/01/20 1608

## 2020-04-03 ENCOUNTER — Encounter: Payer: Self-pay | Admitting: Oncology

## 2020-04-03 DIAGNOSIS — R634 Abnormal weight loss: Secondary | ICD-10-CM | POA: Insufficient documentation

## 2020-04-03 NOTE — Progress Notes (Signed)
Patient would like to discuss need for cane. He also states he would like to discuss pain in right side abdomen. States medication is not helping with pain and would like to know if we could increase dose.

## 2020-04-04 ENCOUNTER — Inpatient Hospital Stay: Payer: Medicare HMO

## 2020-04-04 ENCOUNTER — Encounter: Payer: Self-pay | Admitting: Oncology

## 2020-04-04 ENCOUNTER — Other Ambulatory Visit: Payer: Self-pay

## 2020-04-04 ENCOUNTER — Encounter: Payer: Self-pay | Admitting: Licensed Clinical Social Worker

## 2020-04-04 ENCOUNTER — Inpatient Hospital Stay: Payer: Medicare HMO | Attending: Oncology | Admitting: Oncology

## 2020-04-04 VITALS — BP 148/90 | HR 81 | Temp 98.8°F | Wt 153.2 lb

## 2020-04-04 DIAGNOSIS — R634 Abnormal weight loss: Secondary | ICD-10-CM | POA: Diagnosis not present

## 2020-04-04 DIAGNOSIS — K769 Liver disease, unspecified: Secondary | ICD-10-CM | POA: Insufficient documentation

## 2020-04-04 DIAGNOSIS — G893 Neoplasm related pain (acute) (chronic): Secondary | ICD-10-CM | POA: Diagnosis not present

## 2020-04-04 DIAGNOSIS — I700 Atherosclerosis of aorta: Secondary | ICD-10-CM | POA: Diagnosis not present

## 2020-04-04 DIAGNOSIS — C220 Liver cell carcinoma: Secondary | ICD-10-CM | POA: Diagnosis present

## 2020-04-04 DIAGNOSIS — R1013 Epigastric pain: Secondary | ICD-10-CM | POA: Diagnosis not present

## 2020-04-04 DIAGNOSIS — R160 Hepatomegaly, not elsewhere classified: Secondary | ICD-10-CM

## 2020-04-04 DIAGNOSIS — F17210 Nicotine dependence, cigarettes, uncomplicated: Secondary | ICD-10-CM | POA: Insufficient documentation

## 2020-04-04 DIAGNOSIS — B1920 Unspecified viral hepatitis C without hepatic coma: Secondary | ICD-10-CM | POA: Diagnosis not present

## 2020-04-04 DIAGNOSIS — R531 Weakness: Secondary | ICD-10-CM | POA: Insufficient documentation

## 2020-04-04 DIAGNOSIS — R7989 Other specified abnormal findings of blood chemistry: Secondary | ICD-10-CM | POA: Insufficient documentation

## 2020-04-04 DIAGNOSIS — R339 Retention of urine, unspecified: Secondary | ICD-10-CM | POA: Insufficient documentation

## 2020-04-04 DIAGNOSIS — R1011 Right upper quadrant pain: Secondary | ICD-10-CM | POA: Insufficient documentation

## 2020-04-04 DIAGNOSIS — R772 Abnormality of alphafetoprotein: Secondary | ICD-10-CM | POA: Insufficient documentation

## 2020-04-04 DIAGNOSIS — Z79899 Other long term (current) drug therapy: Secondary | ICD-10-CM | POA: Diagnosis not present

## 2020-04-04 DIAGNOSIS — K7460 Unspecified cirrhosis of liver: Secondary | ICD-10-CM | POA: Insufficient documentation

## 2020-04-04 LAB — COMPREHENSIVE METABOLIC PANEL
ALT: 34 U/L (ref 0–44)
AST: 81 U/L — ABNORMAL HIGH (ref 15–41)
Albumin: 2.2 g/dL — ABNORMAL LOW (ref 3.5–5.0)
Alkaline Phosphatase: 138 U/L — ABNORMAL HIGH (ref 38–126)
Anion gap: 8 (ref 5–15)
BUN: 11 mg/dL (ref 8–23)
CO2: 27 mmol/L (ref 22–32)
Calcium: 8.2 mg/dL — ABNORMAL LOW (ref 8.9–10.3)
Chloride: 98 mmol/L (ref 98–111)
Creatinine, Ser: 1.09 mg/dL (ref 0.61–1.24)
GFR calc Af Amer: >60 mL/min (ref >60)
GFR calc non Af Amer: >60 mL/min (ref >60)
Glucose, Bld: 121 mg/dL — ABNORMAL HIGH (ref 70–99)
Potassium: 4.6 mmol/L (ref 3.5–5.1)
Sodium: 133 mmol/L — ABNORMAL LOW (ref 135–145)
Total Bilirubin: 2.2 mg/dL — ABNORMAL HIGH (ref 0.3–1.2)
Total Protein: 7.4 g/dL (ref 6.5–8.1)

## 2020-04-04 LAB — CBC WITH DIFFERENTIAL/PLATELET
Abs Immature Granulocytes: 0.03 10*3/uL (ref 0.00–0.07)
Basophils Absolute: 0 10*3/uL (ref 0.0–0.1)
Basophils Relative: 0 %
Eosinophils Absolute: 0.1 10*3/uL (ref 0.0–0.5)
Eosinophils Relative: 2 %
HCT: 37.6 % — ABNORMAL LOW (ref 39.0–52.0)
Hemoglobin: 13.5 g/dL (ref 13.0–17.0)
Immature Granulocytes: 0 %
Lymphocytes Relative: 11 %
Lymphs Abs: 0.8 10*3/uL (ref 0.7–4.0)
MCH: 34.1 pg — ABNORMAL HIGH (ref 26.0–34.0)
MCHC: 35.9 g/dL (ref 30.0–36.0)
MCV: 94.9 fL (ref 80.0–100.0)
Monocytes Absolute: 1 10*3/uL (ref 0.1–1.0)
Monocytes Relative: 14 %
Neutro Abs: 5.2 10*3/uL (ref 1.7–7.7)
Neutrophils Relative %: 73 %
Platelets: 164 10*3/uL (ref 150–400)
RBC: 3.96 MIL/uL — ABNORMAL LOW (ref 4.22–5.81)
RDW: 15.6 % — ABNORMAL HIGH (ref 11.5–15.5)
WBC: 7.1 10*3/uL (ref 4.0–10.5)
nRBC: 0 % (ref 0.0–0.2)

## 2020-04-04 LAB — PSA: Prostatic Specific Antigen: 0.39 ng/mL (ref 0.00–4.00)

## 2020-04-04 MED ORDER — OXYCODONE HCL 10 MG PO TABS: 10.0000 mg | ORAL_TABLET | Freq: Four times a day (QID) | ORAL | 0 refills | Status: DC | PRN

## 2020-04-04 NOTE — Progress Notes (Signed)
Met with Joshua Ramos. Introduced Therapist, nutritional and provided contact information for future needs. Transportation has been indentified as a potential barrier. Introduced transportation services and this will be arranged as needed. He is pending biopsy and PET scan. I have spoken with his sister, Joshua Ramos, and brother, Joshua Ramos at his request and updated them on findings and plan.

## 2020-04-04 NOTE — Progress Notes (Signed)
[place]  [phone] [phone]  ID: Joshua Ramos OB: January 02, 1951  MR#: [id number]  LEX#:[id number]  Patient Care Team: Romualdo Bolk, FNP as PCP - General (Nurse Practitioner) Clent Jacks, RN as Oncology Nurse Navigator  CHIEF COMPLAINT: Multiple liver lesions highly concerning for malignancy.  INTERVAL HISTORY: Patient is a 69 year old male who recently presented to the emergency room with abdominal pain.  Subsequent work-up included CT scan which revealed multiple lesions in his liver highly concerning for malignancy.  Patient also admits to a 15 to 20 pound weight loss over the last 4 to 6 weeks.  He also has right leg weakness which is also new.  He has no other neurologic complaints.  He denies any recent fevers or illnesses.  He has no chest pain, shortness of breath, cough, or hemoptysis.  He denies any nausea, vomiting, constipation, or diarrhea.  He has occasional urinary retention.  Patient offers no further specific complaints today.  REVIEW OF SYSTEMS:   Review of Systems  Constitutional: Positive for weight loss. Negative for fever and malaise/fatigue.  Respiratory: Negative.  Negative for cough and shortness of breath.   Cardiovascular: Negative.  Negative for chest pain and leg swelling.  Gastrointestinal: Negative.  Negative for abdominal pain, blood in stool, constipation, nausea and vomiting.  Musculoskeletal: Negative.  Negative for back pain.  Skin: Negative.  Negative for rash.  Neurological: Positive for focal weakness. Negative for dizziness, weakness and headaches.  Psychiatric/Behavioral: Negative.  The patient is not nervous/anxious.     As per HPI. Otherwise, a complete review of systems is negative.  PAST MEDICAL HISTORY: Past Medical History:  Diagnosis Date  . Hepatitis A     PAST SURGICAL HISTORY: History reviewed. No pertinent surgical history.  FAMILY HISTORY: History reviewed. No  pertinent family history.  ADVANCED DIRECTIVES (Y/N):  N  HEALTH MAINTENANCE: Social History   Tobacco Use  . Smoking status: Current Some Day Smoker    Packs/day: 3.00    Types: Cigarettes  . Smokeless tobacco: Never Used  Vaping Use  . Vaping Use: Never used  Substance Use Topics  . Alcohol use: Yes    Comment: occasional drinks  . Drug use: Not on file     Colonoscopy:  PAP:  Bone density:  Lipid panel:  No Known Allergies  Current Outpatient Medications  Medication Sig Dispense Refill  . Acetaminophen-Codeine 300-30 MG tablet Take 1-2 tablets by mouth every 4 (four) hours as needed.    Marland Kitchen buPROPion (WELLBUTRIN SR) 150 MG 12 hr tablet Take 1 tablet by mouth in the morning and at bedtime.    . capsicum (ZOSTRIX) 0.075 % topical cream Apply 1 application topically every 8 (eight) hours.    Marland Kitchen lisinopril-hydrochlorothiazide (ZESTORETIC) 20-12.5 MG tablet Take 1 tablet by mouth daily.    Marland Kitchen oxyCODONE (OXY IR/ROXICODONE) 5 MG immediate release tablet Take 1 tablet (5 mg total) by mouth every 6 (six) hours as needed for severe pain. 10 tablet 0  . naproxen (NAPROSYN) 500 MG tablet Take 500 mg by mouth every 12 (twelve) hours as needed. (Patient not taking: Reported on 04/03/2020)     No current facility-administered medications for this visit.    OBJECTIVE: Vitals:   04/04/20 0918  BP: (!) 148/90  Pulse: 81  Temp: 98.8 F (37.1 C)     There is no height or weight on file to calculate BMI.    ECOG FS:1 - Symptomatic but completely ambulatory  General: Well-developed,  well-nourished, no acute distress. Eyes: Pink conjunctiva, anicteric sclera. HEENT: Normocephalic, moist mucous membranes. Lungs: No audible wheezing or coughing. Heart: Regular rate and rhythm. Abdomen: Soft, nontender, no obvious distention.  Right leg 2 out of 5 strength, left leg 5 out of 5 strength. Musculoskeletal: No edema, cyanosis, or clubbing. Neuro: Alert, answering all questions appropriately.  Cranial nerves grossly intact. Skin: No rashes or petechiae noted. Psych: Normal affect. Lymphatics: No cervical, calvicular, axillary or inguinal LAD.   LAB RESULTS:  Lab Results  Component Value Date   NA 133 (L) 04/04/2020   K 4.6 04/04/2020   CL 98 04/04/2020   CO2 27 04/04/2020   GLUCOSE 121 (H) 04/04/2020   BUN 11 04/04/2020   CREATININE 1.09 04/04/2020   CALCIUM 8.2 (L) 04/04/2020   PROT 7.4 04/04/2020   ALBUMIN 2.2 (L) 04/04/2020   AST 81 (H) 04/04/2020   ALT 34 04/04/2020   ALKPHOS 138 (H) 04/04/2020   BILITOT 2.2 (H) 04/04/2020   GFRNONAA >60 04/04/2020   GFRAA >60 04/04/2020    Lab Results  Component Value Date   WBC 7.1 04/04/2020   NEUTROABS 5.2 04/04/2020   HGB 13.5 04/04/2020   HCT 37.6 (L) 04/04/2020   MCV 94.9 04/04/2020   PLT 164 04/04/2020     STUDIES: CT ABDOMEN PELVIS W WO CONTRAST  Result Date: 04/01/2020 CLINICAL DATA:  Right upper quadrant pain. EXAM: CT ABDOMEN AND PELVIS WITHOUT AND WITH CONTRAST TECHNIQUE: Multidetector CT imaging of the abdomen and pelvis was performed following the standard protocol before and following the bolus administration of intravenous contrast. CONTRAST:  187mL OMNIPAQUE IOHEXOL 300 MG/ML  SOLN COMPARISON:  05/08/2016. FINDINGS: Lower chest: Unremarkable. Hepatobiliary: Nodular liver contour is compatible with cirrhosis. Multiple lesions within the parenchyma show arterial phase hyperenhancement, including 2.1 cm lesion in the dome of the liver on 19/6 and 9 mm lesion in the posterior right liver on 28/6. Heterogeneous arterial phase hyperenhancement in the lateral segment left liver inferiorly measures 9.4 x 4.0 cm. Probable 13 mm cyst in the lateral segment left liver. 2.1 cm low-density lesion posterior right liver may be a cyst. Mild periportal edema. Gallbladder is distended with gallbladder wall edema and pericholecystic fluid. No substantial intra or extrahepatic biliary duct dilatation. Pancreas: No focal mass  lesion. No dilatation of the main duct. No intraparenchymal cyst. No peripancreatic edema. Spleen: No splenomegaly. No focal mass lesion. Adrenals/Urinary Tract: No adrenal nodule or mass. Small hypoattenuating lesion posterior right kidney is too small to characterize but likely benign. Left kidney unremarkable. No evidence for hydroureter. The urinary bladder appears normal for the degree of distention. Stomach/Bowel: Stomach is unremarkable. No gastric wall thickening. No evidence of outlet obstruction. Duodenum is normally positioned as is the ligament of Treitz. No small bowel wall thickening. No small bowel dilatation. The terminal ileum is normal. The appendix is normal. No gross colonic mass. No colonic wall thickening. Vascular/Lymphatic: There is abdominal aortic atherosclerosis without aneurysm. Portal vein, superior mesenteric vein, and splenic vein are patent. Upper normal lymph nodes identified in the hepatoduodenal ligament. 11 mm short axis left para-aortic node on 62/11 is similar to prior suggesting reactive etiology. No pelvic sidewall lymphadenopathy. Reproductive: The prostate gland and seminal vesicles are unremarkable. Other: Small volume free fluid seen in the pelvis an adjacent to the liver. Musculoskeletal: No worrisome lytic or sclerotic osseous abnormality. Superior endplate compression deformity noted at L2 stable since prior. IMPRESSION: 1. Cirrhotic changes in the liver with multiple lesions showing arterial phase  hyperenhancement. This includes a heterogeneous 9.4 x 4.0 cm in the lateral segment left liver. Imaging features are highly concerning for multifocal hepatocellular carcinoma, including a probable infiltrative lesion in the left liver. MRI of the abdomen without and with contrast recommended to further evaluate. 2. Distended gallbladder with gallbladder wall edema and pericholecystic fluid. Imaging features may be related to underlying systemic/liver disease. 3. Small volume  free fluid in the pelvis and adjacent to the liver. 4. Aortic Atherosclerosis (ICD10-I70.0). Electronically Signed   By: Misty Stanley M.D.   On: 04/01/2020 15:37   US ABDOMEN LIMITED RUQ  Result Date: 04/01/2020 CLINICAL DATA:  Epigastric pain for the past 5 days. EXAM: ULTRASOUND ABDOMEN LIMITED RIGHT UPPER QUADRANT COMPARISON:  CT abdomen pelvis dated May 08, 2014. FINDINGS: Gallbladder: Small amount of sludge with prominent asymmetric wall thickening. No gallstones. No sonographic Murphy sign noted by sonographer. Common bile duct: Diameter: 4 mm, normal. Liver: Nodular contour with coarsened, heterogeneously increased parenchymal echogenicity. There are three focal lesions identified in the left hepatic lobe, all containing internal vascularity. The largest measures 4.8 x 4.8 x 4.2 cm. The other two lesions measure 0.9 x 0.9 x 0.8 cm and 1.1 x 1.1 x 0.9 cm. Portal vein is patent on color Doppler imaging with normal direction of blood flow towards the liver. Other: None. IMPRESSION: 1. Cirrhosis with three new lesions in the left hepatic lobe measuring up to 4.8 cm, suspicious for hepatocellular carcinoma. When the patient is clinically stable and able to follow directions and hold their breath (preferably as an outpatient) further evaluation with dedicated liver protocol MRI with and without contrast is recommended. 2. Small amount of gallbladder sludge with prominent asymmetric wall thickening. As there is no other evidence of cholecystitis, wall thickening is favored related to underlying liver disease. If there is strong clinical concern for cholecystitis, consider HIDA scan for further evaluation. Electronically Signed   By: Titus Dubin M.D.   On: 04/01/2020 12:46    ASSESSMENT: Multiple liver lesions highly concerning for malignancy.  PLAN:    1. Multiple liver lesions highly concerning for malignancy: Given patient's underlying cirrhosis, this is highly concerning for multifocal  hepatocellular carcinoma and AFP is pending.  PSA is within normal limits, but other tumor markers are also pending at time of dictation.  If AFP is not pathognomonic for hepatocellular carcinoma, will get ultrasound-guided biopsy of liver lesions to obtain diagnosis.  Patient also have a PET scan to complete the staging work-up.  Return to clinic in 2 weeks to discuss the results and treatment planning.  Patient will also have consultation with palliative care at that time. 2.  Weight loss: Likely secondary to underlying malignancy.  Patient will have consultation with dietary in 2 weeks. 3.  Pain: Patient was given a prescription for oxycodone 10 mg tabs today.  I spent a total of 60 minutes reviewing chart data, face-to-face evaluation with the patient, counseling and coordination of care as detailed above.  Patient expressed understanding and was in agreement with this plan. He also understands that He can call clinic at any time with any questions, concerns, or complaints.   Cancer Staging No matching staging information was found for the patient.  Lloyd Huger, MD   04/04/2020 4:04 PM

## 2020-04-05 LAB — AFP TUMOR MARKER: AFP, Serum, Tumor Marker: 12.1 ng/mL — ABNORMAL HIGH (ref 0.0–8.3)

## 2020-04-05 LAB — CEA: CEA: 4 ng/mL (ref 0.0–4.7)

## 2020-04-05 LAB — CANCER ANTIGEN 19-9: CA 19-9: 192 U/mL — ABNORMAL HIGH (ref 0–35)

## 2020-04-07 ENCOUNTER — Ambulatory Visit: Payer: Medicare HMO | Admitting: Oncology

## 2020-04-07 ENCOUNTER — Other Ambulatory Visit: Payer: Medicare HMO

## 2020-04-10 ENCOUNTER — Telehealth: Payer: Self-pay

## 2020-04-10 NOTE — Telephone Encounter (Signed)
A voicemail was left yesterday by Pamala Hurry in Centralized Scheduling concerning patient's liver biopsy being scheduled. Appointment has been scheduled for Tuesday September 21 with arrival time 9:30 and procedure time 10:30.    Called patient to inform them of appointment. No answer so left message for them to return call to office.

## 2020-04-11 ENCOUNTER — Telehealth: Payer: Self-pay

## 2020-04-11 NOTE — Telephone Encounter (Signed)
Received call from daughter, Thayer Headings. She had questions regarding upcoming appointments. Reviewed all upcoming appointments and questions answered.

## 2020-04-14 ENCOUNTER — Other Ambulatory Visit: Payer: Self-pay | Admitting: Radiology

## 2020-04-14 ENCOUNTER — Telehealth: Payer: Self-pay

## 2020-04-14 NOTE — Progress Notes (Signed)
Patient on schedule for Liver BIopsy 04/15/2020, made aware to be here @ 0930, NPO after MN, and driver post procedure/discharge. Stated understanding.

## 2020-04-14 NOTE — Telephone Encounter (Signed)
Thank you :)

## 2020-04-14 NOTE — Telephone Encounter (Signed)
Received call from Joshua Ramos with questions regarding his upcoming appointments. All questions answered. He is also inquiring about transportation. We had discussed this at his initial visit and he decided he would let his friends provide transportation. He is now in need of transportation. Informed that we would not be able to provide this for his liver biopsy tomorrow since it requires sedation and it is to late in the day to arrange transportation for next day appointments. Message sent to scheduling to assist in arranging transportation for his 9-23 PET scan and his 9-28 appointment with Joshua Ramos.

## 2020-04-15 ENCOUNTER — Other Ambulatory Visit: Payer: Self-pay

## 2020-04-15 ENCOUNTER — Ambulatory Visit
Admission: RE | Admit: 2020-04-15 | Discharge: 2020-04-15 | Disposition: A | Discharge status: Home / Self Care | Payer: Medicare HMO | Source: Ambulatory Visit | Attending: Oncology | Admitting: Oncology

## 2020-04-15 ENCOUNTER — Telehealth: Payer: Self-pay | Admitting: Oncology

## 2020-04-15 DIAGNOSIS — R109 Unspecified abdominal pain: Secondary | ICD-10-CM | POA: Diagnosis not present

## 2020-04-15 DIAGNOSIS — I10 Essential (primary) hypertension: Secondary | ICD-10-CM | POA: Insufficient documentation

## 2020-04-15 DIAGNOSIS — Z8619 Personal history of other infectious and parasitic diseases: Secondary | ICD-10-CM | POA: Insufficient documentation

## 2020-04-15 DIAGNOSIS — K828 Other specified diseases of gallbladder: Secondary | ICD-10-CM | POA: Diagnosis not present

## 2020-04-15 DIAGNOSIS — Z7901 Long term (current) use of anticoagulants: Secondary | ICD-10-CM | POA: Insufficient documentation

## 2020-04-15 DIAGNOSIS — I700 Atherosclerosis of aorta: Secondary | ICD-10-CM | POA: Insufficient documentation

## 2020-04-15 DIAGNOSIS — F17210 Nicotine dependence, cigarettes, uncomplicated: Secondary | ICD-10-CM | POA: Diagnosis not present

## 2020-04-15 DIAGNOSIS — R160 Hepatomegaly, not elsewhere classified: Secondary | ICD-10-CM

## 2020-04-15 DIAGNOSIS — C220 Liver cell carcinoma: Secondary | ICD-10-CM | POA: Diagnosis present

## 2020-04-15 DIAGNOSIS — Z79899 Other long term (current) drug therapy: Secondary | ICD-10-CM | POA: Insufficient documentation

## 2020-04-15 LAB — CBC
HCT: 39 % (ref 39.0–52.0)
Hemoglobin: 14 g/dL (ref 13.0–17.0)
MCH: 33.9 pg (ref 26.0–34.0)
MCHC: 35.9 g/dL (ref 30.0–36.0)
MCV: 94.4 fL (ref 80.0–100.0)
Platelets: 242 10*3/uL (ref 150–400)
RBC: 4.13 MIL/uL — ABNORMAL LOW (ref 4.22–5.81)
RDW: 15.7 % — ABNORMAL HIGH (ref 11.5–15.5)
WBC: 5.8 10*3/uL (ref 4.0–10.5)
nRBC: 0 % (ref 0.0–0.2)

## 2020-04-15 LAB — PROTIME-INR
INR: 1.2 (ref 0.8–1.2)
Prothrombin Time: 15.1 seconds (ref 11.4–15.2)

## 2020-04-15 IMAGING — US US BIOPSY CORE LIVER
1 series · 10 of 10 positions shown · non-contrast
Comparison: none

INDICATION: Hepatitis-C, cirrhosis and multiple hepatic masses.

[Series 1: us biopsy · 10 of 10 slices shown]
[im 1/10]
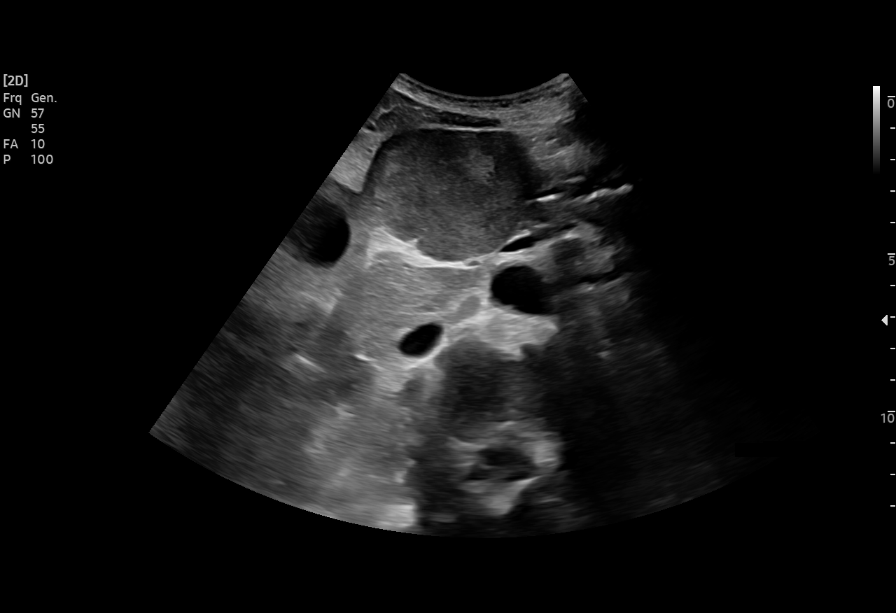
[im 2/10]
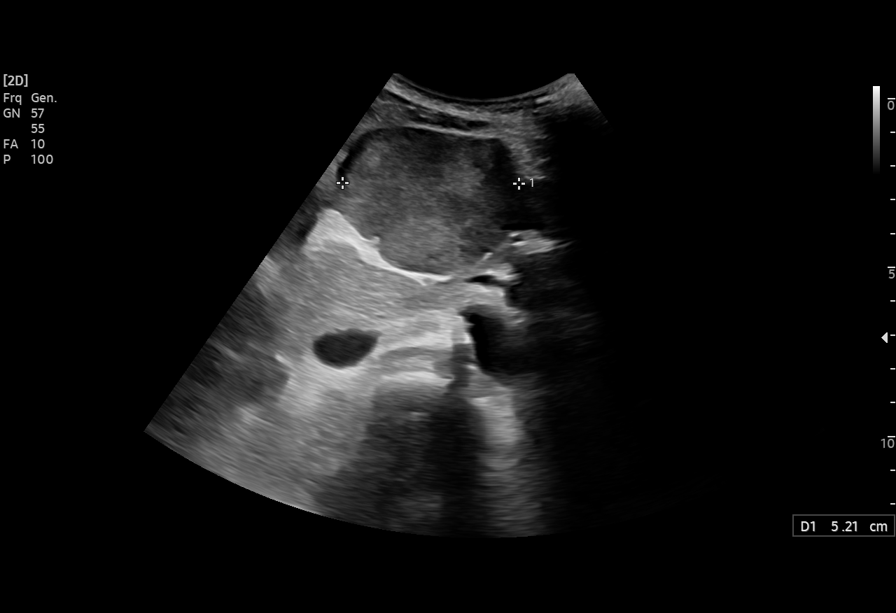
[im 3/10]
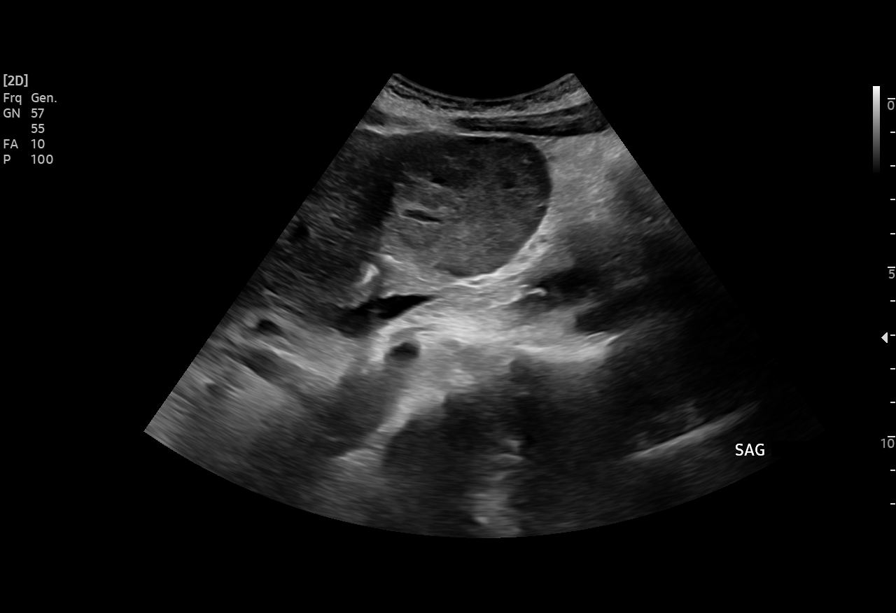
[im 4/10]
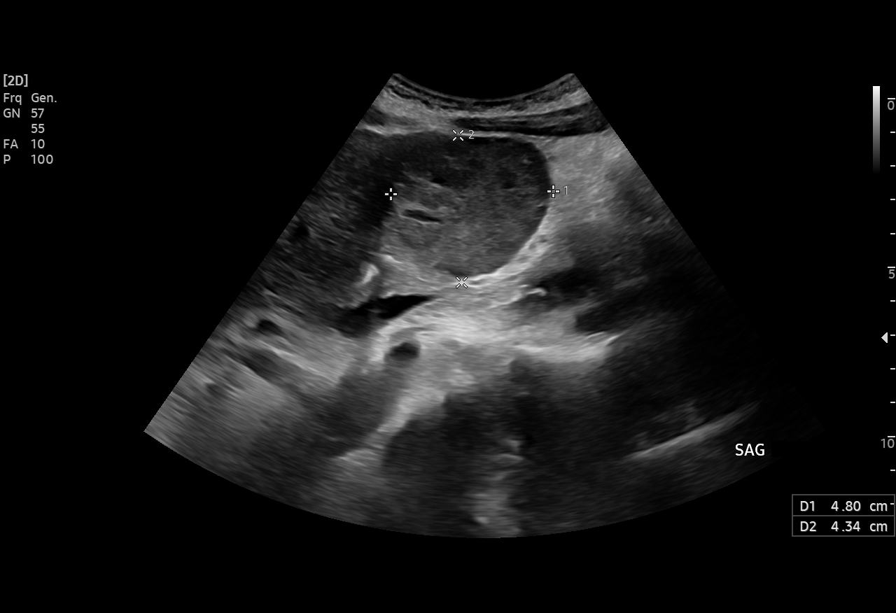
[im 5/10]
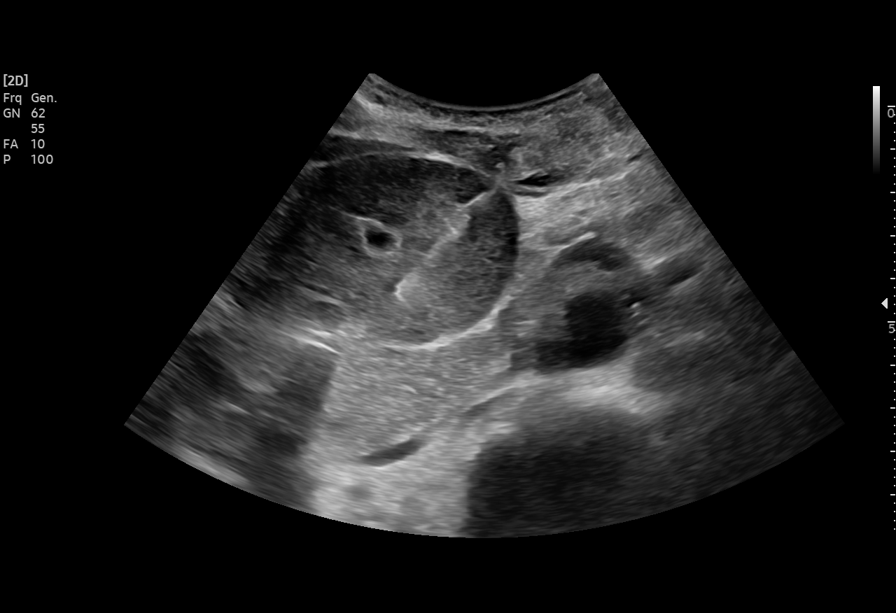
[im 6/10]
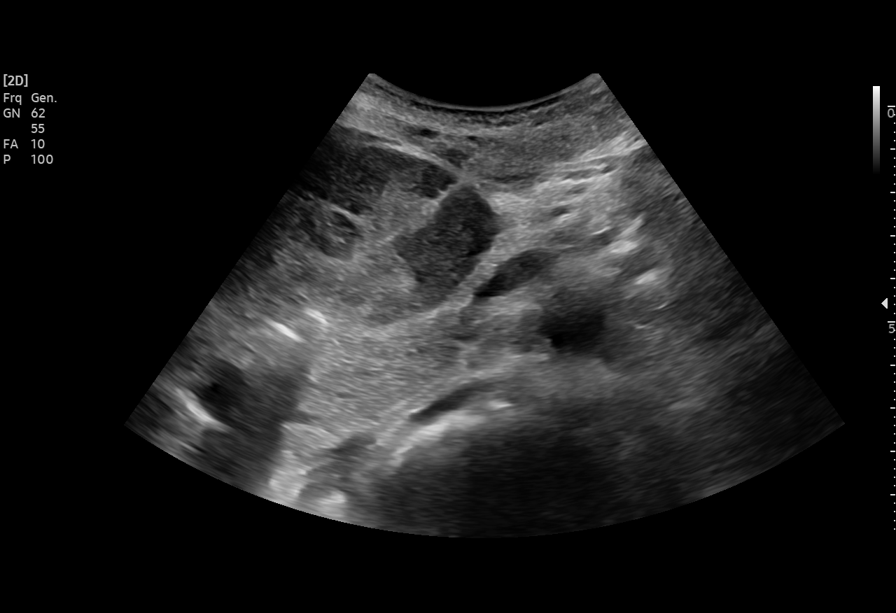
[im 7/10]
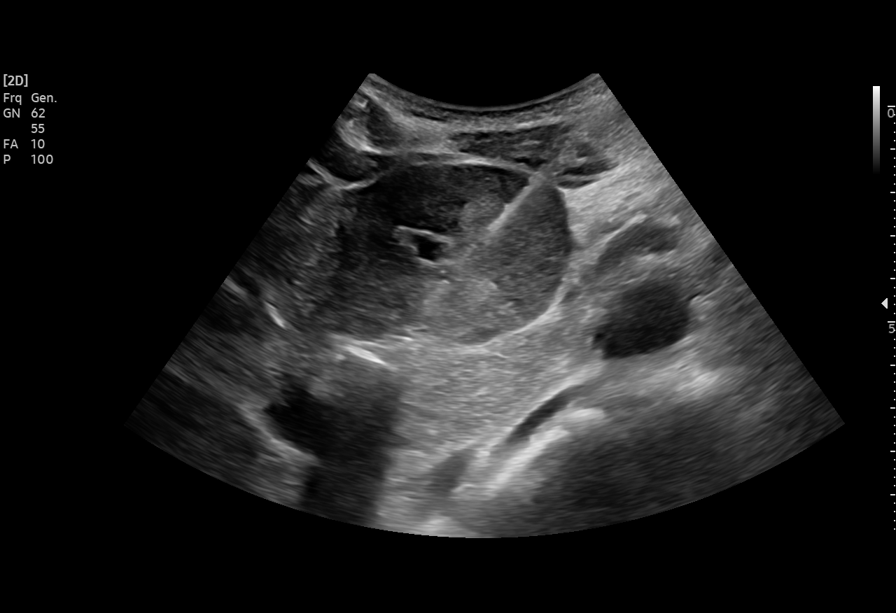
[im 8/10]
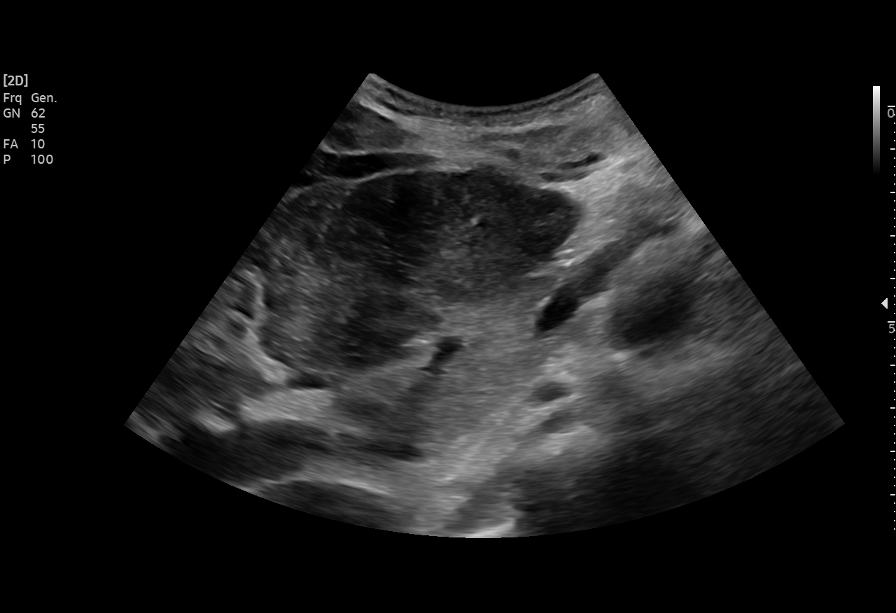
[im 9/10]
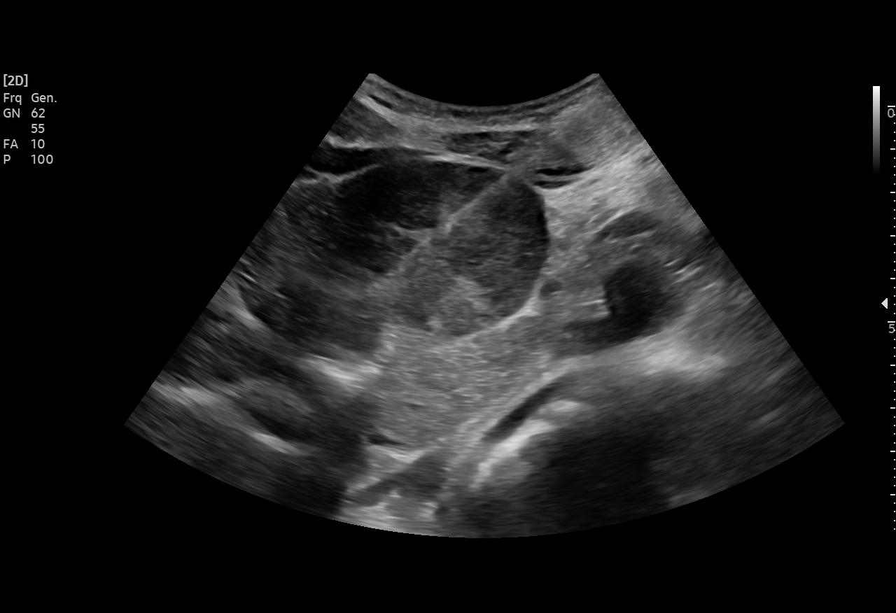
[im 10/10]
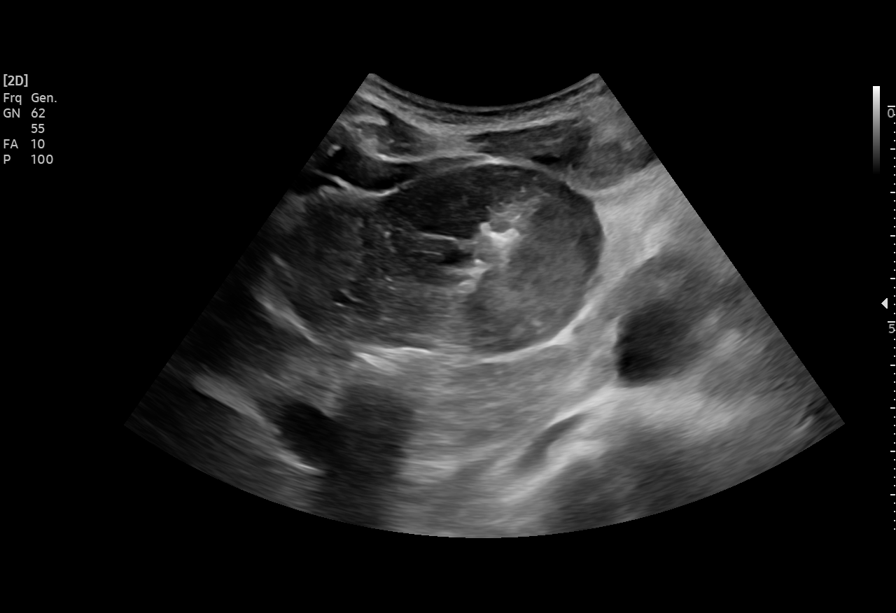

[10 of 10 positions shown; findings below may reference images not displayed]

EXAM:
ULTRASOUND GUIDED CORE BIOPSY OF LIVER MASS

MEDICATIONS:
None.

ANESTHESIA/SEDATION:
Fentanyl 50 mcg IV; Versed 1.0 mg IV

Moderate Sedation Time:  14 minutes.

The patient was continuously monitored during the procedure by the
interventional radiology nurse under my direct supervision.

PROCEDURE:
The procedure, risks, benefits, and alternatives were explained to
the patient. Questions regarding the procedure were encouraged and
answered. The patient understands and consents to the procedure.

Ultrasound was performed of the liver. The abdominal wall was
prepped with chlorhexidine in a sterile fashion, and a sterile drape
was applied covering the operative field. A sterile gown and sterile
gloves were used for the procedure. Local anesthesia was provided
with 1% Lidocaine.

Under ultrasound guidance, a 17 gauge trocar needle was advanced to
the level of a hepatic mass. After confirming needle tip position,
coaxial 18 gauge core biopsy samples were obtained. Four samples
were obtained and submitted in formalin. A slurry of Gel-Foam
pledgets was then injected through the outer needle as it was
retracted and removed.

COMPLICATIONS:
None immediate.
FINDINGS: Well-circumscribed rounded exophytic mass is seen off of the
inferior aspect of the left lobe measuring approximately 5.2 x 4.8 x
4.3 cm. More ill-defined parenchymal abnormality is also present
above the level of the rounded tumor mass within the left lobe. The
more discrete mass was targeted for tissue sampling. Solid tissue
samples were obtained. Post biopsy imaging demonstrates no
hemorrhage around the mass after biopsy.
IMPRESSION: Ultrasound-guided core biopsy performed at the level of an exophytic
mass off of the left lobe of the liver measuring just over 5 cm in
greatest diameter.

## 2020-04-15 MED ORDER — FENTANYL CITRATE (PF) 100 MCG/2ML IJ SOLN
INTRAMUSCULAR | Status: AC | PRN
  Administered 2020-04-15: 50 ug via INTRAVENOUS

## 2020-04-15 MED ORDER — MIDAZOLAM HCL 2 MG/2ML IJ SOLN
INTRAMUSCULAR | Status: AC
  Filled 2020-04-15: qty 2

## 2020-04-15 MED ORDER — SODIUM CHLORIDE 0.9 % IV SOLN: INTRAVENOUS | Status: DC

## 2020-04-15 MED ORDER — FENTANYL CITRATE (PF) 100 MCG/2ML IJ SOLN
INTRAMUSCULAR | Status: AC
  Filled 2020-04-15: qty 2

## 2020-04-15 MED ORDER — MIDAZOLAM HCL 2 MG/2ML IJ SOLN
INTRAMUSCULAR | Status: AC | PRN
  Administered 2020-04-15: 1 mg via INTRAVENOUS

## 2020-04-15 NOTE — Procedures (Signed)
Interventional Radiology Procedure Note  Procedure: US Guided Biopsy of liver mass  Complications: None  Estimated Blood Loss: < 10 mL  Findings: 18 G core biopsy of left lobe liver mass performed under US guidance.  Four core samples obtained and sent to Pathology.  Venetia Night. Kathlene Cote, M.D Pager:  [phone]

## 2020-04-15 NOTE — Consult Note (Addendum)
Chief Complaint: Patient was seen in consultation today for image guided liver lesion biopsy  Referring Physician(s): Finnegan,Timothy J  Supervising Physician: Aletta Edouard  Patient Status: [facility] - Out-pt  History of Present Illness: Joshua Ramos is a 69 y.o. male smoker with past medical history of hepatitis C, HTN, remote substance abuse who recently presented to the [facility] emergency room with abdominal pain and weight loss.  Subsequent imaging revealed:  1. 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 This includes a heterogeneous 9.4 x 4.0 cm in the lateral segment left liver. Imaging features are highly concerning for multifocal hepatocellular carcinoma, including a probable infiltrative lesion in the left liver. MRI of the abdomen without and with contrast recommended to further evaluate. 2. Distended gallbladder with gallbladder wall edema and pericholecystic fluid. Imaging features may be related to underlying systemic/liver disease. 3. Small volume free fluid in the pelvis and adjacent to the liver. 4. Aortic Atherosclerosis  His PSA is normal, CEA normal, CA 19-9 is 92, AFP is 12.9.  He presents today for image guided liver lesion biopsy with further evaluation.      Past Medical History:  Diagnosis Date   Hepatitis A     History reviewed. No pertinent surgical history.  Allergies: Patient has no known allergies.  Medications: Prior to Admission medications   Medication Sig Start Date End Date Taking? Authorizing Provider  lisinopril-hydrochlorothiazide (ZESTORETIC) 20-12.5 MG tablet Take 1 tablet by mouth daily. 03/19/20 03/19/21 Yes [provider]  Acetaminophen-Codeine 300-30 MG tablet Take 1-2 tablets by mouth every 4 (four) hours as needed. 03/19/20 04/18/21  [provider]  buPROPion (WELLBUTRIN SR) 150 MG 12 hr tablet Take 1 tablet by mouth in the morning and at bedtime.  05/15/15   [provider]  capsicum (ZOSTRIX) 0.075 % topical cream Apply 1 application topically every 8 (eight) hours. Patient not taking: Reported on 04/15/2020 08/03/11   [provider]  naproxen (NAPROSYN) 500 MG tablet Take 500 mg by mouth every 12 (twelve) hours as needed. Patient not taking: Reported on 04/03/2020 07/18/19 07/17/20  [provider]  Oxycodone HCl 10 MG TABS Take 1 tablet (10 mg total) by mouth every 6 (six) hours as needed. 04/04/20   Lloyd Huger, MD     History reviewed. No pertinent family history.  Social History   Socioeconomic History   Marital status: Single    Spouse name: Not on file   Number of children: 0   Years of education: Not on file   Highest education level: Not on file  Occupational History   Occupation: retired  Tobacco Use   Smoking status: Current Some Day Smoker    Packs/day: 0.50    Types: Cigarettes   Smokeless tobacco: Never Used   Tobacco comment: 1 cigarette today  Vaping Use   Vaping Use: Never used  Substance and Sexual Activity   Alcohol use: Yes    Comment: occasional drinks   Drug use: Not on file   Sexual activity: Not on file  Other Topics Concern   Not on file  Social History Narrative   Lives by himself   Social Determinants of Health   Financial Resource Strain:    Difficulty of Paying Living Expenses: Not on file  Food Insecurity:    Worried About Charity fundraiser in the Last Year: Not on file   [employer] of Food in the Last Year: Not on file  Transportation Needs:  Lack of Transportation (Medical): Not on file   Lack of Transportation (Non-Medical): Not on file  Physical Activity:    Days of Exercise per Week: Not on file   Minutes of Exercise per Session: Not on file  Stress:    Feeling of Stress : Not on file  Social Connections:    Frequency of Communication with Friends and Family: Not on file   Frequency of Social Gatherings with Friends  and Family: Not on file   Attends Religious Services: Not on file   Active Member of Clubs or Organizations: Not on file   Attends Archivist Meetings: Not on file   Marital Status: Not on file      Review of Systems currently denies fever, headache, chest pain, dyspnea, cough, back pain, nausea, vomiting or bleeding  Vital Signs: BP (!) 160/86    Pulse 69    Temp 98.1 F (36.7 C) (Oral)    Resp 18    Ht 6\' 1"  (1.854 m)    Wt 150 lb (68 kg)    SpO2 99%    BMI 19.79 kg/m   Physical Exam awake, alert.  Chest clear to auscultation bilaterally.  Heart with regular rate and rhythm.  Abdomen soft, positive bowel sounds, tender right upper quadrant/epigastric region to palpation.  No lower extremity edema  Imaging: CT ABDOMEN PELVIS W WO CONTRAST  Result Date: 04/01/2020 CLINICAL DATA:  Right upper quadrant pain. EXAM: CT ABDOMEN AND PELVIS WITHOUT AND WITH CONTRAST TECHNIQUE: Multidetector CT imaging of the abdomen and pelvis was performed following the standard protocol before and following the bolus administration of intravenous contrast. CONTRAST:  133mL OMNIPAQUE IOHEXOL 300 MG/ML  SOLN COMPARISON:  05/08/2016. FINDINGS: Lower chest: Unremarkable. Hepatobiliary: Nodular liver contour is compatible with cirrhosis. Multiple lesions within the parenchyma show arterial phase hyperenhancement, including 2.1 cm lesion in the dome of the liver on 19/6 and 9 mm lesion in the posterior right liver on 28/6. Heterogeneous arterial phase hyperenhancement in the lateral segment left liver inferiorly measures 9.4 x 4.0 cm. Probable 13 mm cyst in the lateral segment left liver. 2.1 cm low-density lesion posterior right liver may be a cyst. Mild periportal edema. Gallbladder is distended with gallbladder wall edema and pericholecystic fluid. No substantial intra or extrahepatic biliary duct dilatation. Pancreas: No focal mass lesion. No dilatation of the main duct. No intraparenchymal cyst. No  peripancreatic edema. Spleen: No splenomegaly. No focal mass lesion. Adrenals/Urinary Tract: No adrenal nodule or mass. Small hypoattenuating lesion posterior right kidney is too small to characterize but likely benign. Left kidney unremarkable. No evidence for hydroureter. The urinary bladder appears normal for the degree of distention. Stomach/Bowel: Stomach is unremarkable. No gastric wall thickening. No evidence of outlet obstruction. Duodenum is normally positioned as is the ligament of Treitz. No small bowel wall thickening. No small bowel dilatation. The terminal ileum is normal. The appendix is normal. No gross colonic mass. No colonic wall thickening. Vascular/Lymphatic: There is abdominal aortic atherosclerosis without aneurysm. Portal vein, superior mesenteric vein, and splenic vein are patent. Upper normal lymph nodes identified in the hepatoduodenal ligament. 11 mm short axis left para-aortic node on 62/11 is similar to prior suggesting reactive etiology. No pelvic sidewall lymphadenopathy. Reproductive: The prostate gland and seminal vesicles are unremarkable. Other: Small volume free fluid seen in the pelvis an adjacent to the liver. Musculoskeletal: No worrisome lytic or sclerotic osseous abnormality. Superior endplate compression deformity noted at L2 stable since prior. IMPRESSION: 1. Cirrhotic changes in  the liver with multiple lesions showing arterial phase hyperenhancement. This includes a heterogeneous 9.4 x 4.0 cm in the lateral segment left liver. Imaging features are highly concerning for multifocal hepatocellular carcinoma, including a probable infiltrative lesion in the left liver. MRI of the abdomen without and with contrast recommended to further evaluate. 2. Distended gallbladder with gallbladder wall edema and pericholecystic fluid. Imaging features may be related to underlying systemic/liver disease. 3. Small volume free fluid in the pelvis and adjacent to the liver. 4. Aortic  Atherosclerosis (ICD10-I70.0). Electronically Signed   By: Misty Stanley M.D.   On: 04/01/2020 15:37   US ABDOMEN LIMITED RUQ  Result Date: 04/01/2020 CLINICAL DATA:  Epigastric pain for the past 5 days. EXAM: ULTRASOUND ABDOMEN LIMITED RIGHT UPPER QUADRANT COMPARISON:  CT abdomen pelvis dated May 08, 2014. FINDINGS: Gallbladder: Small amount of sludge with prominent asymmetric wall thickening. No gallstones. No sonographic Murphy sign noted by sonographer. Common bile duct: Diameter: 4 mm, normal. Liver: Nodular contour with coarsened, heterogeneously increased parenchymal echogenicity. There are three focal lesions identified in the left hepatic lobe, all containing internal vascularity. The largest measures 4.8 x 4.8 x 4.2 cm. The other two lesions measure 0.9 x 0.9 x 0.8 cm and 1.1 x 1.1 x 0.9 cm. Portal vein is patent on color Doppler imaging with normal direction of blood flow towards the liver. Other: None. IMPRESSION: 1. Cirrhosis with three new lesions in the left hepatic lobe measuring up to 4.8 cm, suspicious for hepatocellular carcinoma. When the patient is clinically stable and able to follow directions and hold their breath (preferably as an outpatient) further evaluation with dedicated liver protocol MRI with and without contrast is recommended. 2. Small amount of gallbladder sludge with prominent asymmetric wall thickening. As there is no other evidence of cholecystitis, wall thickening is favored related to underlying liver disease. If there is strong clinical concern for cholecystitis, consider HIDA scan for further evaluation. Electronically Signed   By: Titus Dubin M.D.   On: 04/01/2020 12:46    Labs:  CBC: Recent Labs    04/01/20 1023 04/04/20 1007 04/15/20 1009  WBC 7.3 7.1 5.8  HGB 13.3 13.5 14.0  HCT 37.5* 37.6* 39.0  PLT 131* 164 242    COAGS: Recent Labs    04/01/20 1324 04/15/20 1009  INR 1.4* 1.2    BMP: Recent Labs    04/01/20 1023 04/04/20 1007    NA 133* 133*  K 4.3 4.6  CL 101 98  CO2 28 27  GLUCOSE 90 121*  BUN 11 11  CALCIUM 8.2* 8.2*  CREATININE 1.02 1.09  GFRNONAA >60 >60  GFRAA >60 >60    LIVER FUNCTION TESTS: Recent Labs    04/01/20 1023 04/04/20 1007  BILITOT 2.5* 2.2*  AST 102* 81*  ALT 41 34  ALKPHOS 129* 138*  PROT 7.3 7.4  ALBUMIN 2.3* 2.2*    TUMOR MARKERS: No results for input(s): AFPTM, CEA, CA199, CHROMGRNA in the last 8760 hours.  Assessment and Plan: 69 y.o. male smoker with past medical history of hepatitis C, HTN, remote substance abuse who recently presented to the [facility] emergency room with abdominal pain and weight loss.  Subsequent imaging revealed:  1. 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 This includes a heterogeneous 9.4 x 4.0 cm in the lateral segment left liver. Imaging features are highly concerning for multifocal hepatocellular carcinoma, including a probable infiltrative lesion in the left liver. MRI of the abdomen without  and with contrast recommended to further evaluate. 2. Distended gallbladder with gallbladder wall edema and pericholecystic fluid. Imaging features may be related to underlying systemic/liver disease. 3. Small volume free fluid in the pelvis and adjacent to the liver. 4. Aortic Atherosclerosis  His PSA is normal, CEA normal, CA 19-9 is 92, AFP is 12.9.  He presents today for image guided liver lesion biopsy with further evaluation.Risks and benefits of procedure was discussed with the patient  including, but not limited to bleeding, infection, damage to adjacent structures or low yield requiring additional tests.  All of the questions were answered and there is agreement to proceed.  Consent signed and in chart.    Thank you for this interesting consult.  I greatly enjoyed meeting Perl Kerney and look forward to participating in their care.  A copy of this report was sent to the requesting  provider on this date.  Electronically Signed: D. Rowe Robert, PA-C 04/15/2020, 10:42 AM   I spent a total of  25 minutes   in face to face in clinical consultation, greater than 50% of which was counseling/coordinating care for image guided liver lesion biopsy

## 2020-04-15 NOTE — Telephone Encounter (Signed)
Ok, but please make sure he keeps the 9/28 appt to discuss the results of his biopsy today.

## 2020-04-15 NOTE — Progress Notes (Signed)
Patient clinically stable post Liver biopsy per Dr Kathlene Cote, tolerated well. Denies complaints post procedure. Received Versed 1 mg along with Fentanyl 50 mcg IV for procedure. Report given to Fransico Michael Rn post procedure. Vitals stable at this time.

## 2020-04-15 NOTE — Telephone Encounter (Signed)
I left pt a VM that his scans had been canceled for 9/23 due to insurance authorization. Pt informed to contact office for more clarity

## 2020-04-16 ENCOUNTER — Other Ambulatory Visit: Payer: Self-pay | Admitting: Pathology

## 2020-04-16 ENCOUNTER — Encounter: Payer: Self-pay | Admitting: *Deleted

## 2020-04-16 ENCOUNTER — Telehealth: Payer: Self-pay

## 2020-04-16 LAB — SURGICAL PATHOLOGY

## 2020-04-16 NOTE — Telephone Encounter (Signed)
Received voicemail from Joshua Ramos with questions about his appointments tomorrow. He heard there were some changes. Call returned with no answer. Left voicemail and will try again in the morning.

## 2020-04-17 ENCOUNTER — Ambulatory Visit: Payer: Medicare HMO

## 2020-04-18 DIAGNOSIS — C220 Liver cell carcinoma: Secondary | ICD-10-CM | POA: Insufficient documentation

## 2020-04-18 NOTE — Progress Notes (Signed)
[place]  [phone]) [phone] Fax:(336) [phone]  ID: Joshua Ramos OB: 02/19/1951  MR#: [id number]  QIH#:[id number]  Patient Care Team: Romualdo Bolk, FNP as PCP - General (Nurse Practitioner) Clent Jacks, RN as Oncology Nurse Navigator  CHIEF COMPLAINT: [facility]  INTERVAL HISTORY: Patient returns to clinic today for further evaluation, discussion of his biopsy results, and treatment planning. He currently feels well. His appetite has improved and his weight is stable. He continues to have right leg weakness.  He has no other neurologic complaints.  He denies any recent fevers or illnesses.  He has no chest pain, shortness of breath, cough, or hemoptysis. He denies any abdominal pain. He denies any nausea, vomiting, constipation, or diarrhea.  He has occasional urinary retention. Patient offers no further specific complaints today.  REVIEW OF SYSTEMS:   Review of Systems  Constitutional: Positive for malaise/fatigue. Negative for fever and weight loss.  Respiratory: Negative.  Negative for cough and shortness of breath.   Cardiovascular: Negative.  Negative for chest pain and leg swelling.  Gastrointestinal: Negative.  Negative for abdominal pain, blood in stool, constipation, nausea and vomiting.  Musculoskeletal: Negative.  Negative for back pain.  Skin: Negative.  Negative for rash.  Neurological: Positive for focal weakness and weakness. Negative for dizziness and headaches.  Psychiatric/Behavioral: Negative.  The patient is not nervous/anxious.     As per HPI. Otherwise, a complete review of systems is negative.  PAST MEDICAL HISTORY: Past Medical History:  Diagnosis Date  . Hepatitis A     PAST SURGICAL HISTORY: No past surgical history on file.  FAMILY HISTORY: No family history on file.  ADVANCED DIRECTIVES (Y/N):  N  HEALTH MAINTENANCE: Social History   Tobacco Use  . Smoking status: Current Some Day Smoker    Packs/day:  0.50    Types: Cigarettes  . Smokeless tobacco: Never Used  . Tobacco comment: 1 cigarette today  Vaping Use  . Vaping Use: Never used  Substance Use Topics  . Alcohol use: Yes    Comment: occasional drinks  . Drug use: Not on file     Colonoscopy:  PAP:  Bone density:  Lipid panel:  No Known Allergies  Current Outpatient Medications  Medication Sig Dispense Refill  . Acetaminophen-Codeine 300-30 MG tablet Take 1-2 tablets by mouth every 4 (four) hours as needed.    Marland Kitchen buPROPion (WELLBUTRIN SR) 150 MG 12 hr tablet Take 1 tablet by mouth in the morning and at bedtime.    Marland Kitchen lisinopril-hydrochlorothiazide (ZESTORETIC) 20-12.5 MG tablet Take 1 tablet by mouth daily.    . naproxen (NAPROSYN) 500 MG tablet Take 500 mg by mouth every 12 (twelve) hours as needed.     . capsicum (ZOSTRIX) 0.075 % topical cream Apply 1 application topically every 8 (eight) hours.  (Patient not taking: Reported on 04/22/2020)    . oxyCODONE (OXY IR/ROXICODONE) 5 MG immediate release tablet Take 1 tablet (5 mg total) by mouth every 6 (six) hours as needed for severe pain. 30 tablet 0   No current facility-administered medications for this visit.    OBJECTIVE: Vitals:   04/22/20 1017  BP: (!) 154/85  Pulse: 74  Resp: 20  Temp: (!) 97.3 F (36.3 C)  SpO2: 100%     Body mass index is 20.52 kg/m.    ECOG FS:1 - Symptomatic but completely ambulatory  General: Thin, no acute distress. Eyes: Pink conjunctiva, anicteric sclera. HEENT: Normocephalic, moist mucous membranes. Lungs: No audible wheezing or  coughing. Heart: Regular rate and rhythm. Abdomen: Soft, nontender, no obvious distention. Musculoskeletal: No edema, cyanosis, or clubbing. Neuro: Alert, answering all questions appropriately. Cranial nerves grossly intact. Skin: No rashes or petechiae noted. Psych: Normal affect.   LAB RESULTS:  Lab Results  Component Value Date   NA 133 (L) 04/04/2020   K 4.6 04/04/2020   CL 98 04/04/2020    CO2 27 04/04/2020   GLUCOSE 121 (H) 04/04/2020   BUN 11 04/04/2020   CREATININE 1.09 04/04/2020   CALCIUM 8.2 (L) 04/04/2020   PROT 7.4 04/04/2020   ALBUMIN 2.2 (L) 04/04/2020   AST 81 (H) 04/04/2020   ALT 34 04/04/2020   ALKPHOS 138 (H) 04/04/2020   BILITOT 2.2 (H) 04/04/2020   GFRNONAA >60 04/04/2020   GFRAA >60 04/04/2020    Lab Results  Component Value Date   WBC 5.8 04/15/2020   NEUTROABS 5.2 04/04/2020   HGB 14.0 04/15/2020   HCT 39.0 04/15/2020   MCV 94.4 04/15/2020   PLT 242 04/15/2020     STUDIES: CT ABDOMEN PELVIS W WO CONTRAST  Result Date: 04/01/2020 CLINICAL DATA:  Right upper quadrant pain. EXAM: CT ABDOMEN AND PELVIS WITHOUT AND WITH CONTRAST TECHNIQUE: Multidetector CT imaging of the abdomen and pelvis was performed following the standard protocol before and following the bolus administration of intravenous contrast. CONTRAST:  116mL OMNIPAQUE IOHEXOL 300 MG/ML  SOLN COMPARISON:  05/08/2016. FINDINGS: Lower chest: Unremarkable. Hepatobiliary: Nodular liver contour is compatible with cirrhosis. Multiple lesions within the parenchyma show arterial phase hyperenhancement, including 2.1 cm lesion in the dome of the liver on 19/6 and 9 mm lesion in the posterior right liver on 28/6. Heterogeneous arterial phase hyperenhancement in the lateral segment left liver inferiorly measures 9.4 x 4.0 cm. Probable 13 mm cyst in the lateral segment left liver. 2.1 cm low-density lesion posterior right liver may be a cyst. Mild periportal edema. Gallbladder is distended with gallbladder wall edema and pericholecystic fluid. No substantial intra or extrahepatic biliary duct dilatation. Pancreas: No focal mass lesion. No dilatation of the main duct. No intraparenchymal cyst. No peripancreatic edema. Spleen: No splenomegaly. No focal mass lesion. Adrenals/Urinary Tract: No adrenal nodule or mass. Small hypoattenuating lesion posterior right kidney is too small to characterize but likely  benign. Left kidney unremarkable. No evidence for hydroureter. The urinary bladder appears normal for the degree of distention. Stomach/Bowel: Stomach is unremarkable. No gastric wall thickening. No evidence of outlet obstruction. Duodenum is normally positioned as is the ligament of Treitz. No small bowel wall thickening. No small bowel dilatation. The terminal ileum is normal. The appendix is normal. No gross colonic mass. No colonic wall thickening. Vascular/Lymphatic: There is abdominal aortic atherosclerosis without aneurysm. Portal vein, superior mesenteric vein, and splenic vein are patent. Upper normal lymph nodes identified in the hepatoduodenal ligament. 11 mm short axis left para-aortic node on 62/11 is similar to prior suggesting reactive etiology. No pelvic sidewall lymphadenopathy. Reproductive: The prostate gland and seminal vesicles are unremarkable. Other: Small volume free fluid seen in the pelvis an adjacent to the liver. Musculoskeletal: No worrisome lytic or sclerotic osseous abnormality. Superior endplate compression deformity noted at L2 stable since prior. IMPRESSION: 1. 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 This includes a heterogeneous 9.4 x 4.0 cm in the lateral segment left liver. Imaging features are highly concerning for multifocal hepatocellular carcinoma, including a probable infiltrative lesion in the left liver. MRI of the abdomen without and with contrast recommended to  further evaluate. 2. Distended gallbladder with gallbladder wall edema and pericholecystic fluid. Imaging features may be related to underlying systemic/liver disease. 3. Small volume free fluid in the pelvis and adjacent to the liver. 4. Aortic Atherosclerosis (ICD10-I70.0). Electronically Signed   By: Misty Stanley M.D.   On: 04/01/2020 15:37   US BIOPSY (LIVER)  Result Date: 04/15/2020 INDICATION: Hepatitis-C, cirrhosis and multiple hepatic masses. EXAM:  ULTRASOUND GUIDED CORE BIOPSY OF LIVER MASS MEDICATIONS: None. ANESTHESIA/SEDATION: Fentanyl 50 mcg IV; Versed 1.0 mg IV Moderate Sedation Time:  14 minutes. The patient was continuously monitored during the procedure by the interventional radiology nurse under my direct supervision. PROCEDURE: The procedure, risks, benefits, and alternatives were explained to the patient. Questions regarding the procedure were encouraged and answered. The patient understands and consents to the procedure. Ultrasound was performed of the liver. The abdominal wall was prepped with chlorhexidine in a sterile fashion, and a sterile drape was applied covering the operative field. A sterile gown and sterile gloves were used for the procedure. Local anesthesia was provided with 1% Lidocaine. Under ultrasound guidance, a 17 gauge trocar needle was advanced to the level of a hepatic mass. After confirming needle tip position, coaxial 18 gauge core biopsy samples were obtained. Four samples were obtained and submitted in formalin. A slurry of Gel-Foam pledgets was then injected through the outer needle as it was retracted and removed. COMPLICATIONS: None immediate. FINDINGS: Well-circumscribed rounded exophytic mass is seen off of the inferior aspect of the left lobe measuring approximately 5.2 x 4.8 x 4.3 cm. More ill-defined parenchymal abnormality is also present above the level of the rounded tumor mass within the left lobe. The more discrete mass was targeted for tissue sampling. Solid tissue samples were obtained. Post biopsy imaging demonstrates no hemorrhage around the mass after biopsy. IMPRESSION: Ultrasound-guided core biopsy performed at the level of an exophytic mass off of the left lobe of the liver measuring just over 5 cm in greatest diameter. Electronically Signed   By: Aletta Edouard M.D.   On: 04/15/2020 13:17   US ABDOMEN LIMITED RUQ  Result Date: 04/01/2020 CLINICAL DATA:  Epigastric pain for the past 5 days. EXAM:  ULTRASOUND ABDOMEN LIMITED RIGHT UPPER QUADRANT COMPARISON:  CT abdomen pelvis dated May 08, 2014. FINDINGS: Gallbladder: Small amount of sludge with prominent asymmetric wall thickening. No gallstones. No sonographic Murphy sign noted by sonographer. Common bile duct: Diameter: 4 mm, normal. Liver: Nodular contour with coarsened, heterogeneously increased parenchymal echogenicity. There are three focal lesions identified in the left hepatic lobe, all containing internal vascularity. The largest measures 4.8 x 4.8 x 4.2 cm. The other two lesions measure 0.9 x 0.9 x 0.8 cm and 1.1 x 1.1 x 0.9 cm. Portal vein is patent on color Doppler imaging with normal direction of blood flow towards the liver. Other: None. IMPRESSION: 1. Cirrhosis with three new lesions in the left hepatic lobe measuring up to 4.8 cm, suspicious for hepatocellular carcinoma. When the patient is clinically stable and able to follow directions and hold their breath (preferably as an outpatient) further evaluation with dedicated liver protocol MRI with and without contrast is recommended. 2. Small amount of gallbladder sludge with prominent asymmetric wall thickening. As there is no other evidence of cholecystitis, wall thickening is favored related to underlying liver disease. If there is strong clinical concern for cholecystitis, consider HIDA scan for further evaluation. Electronically Signed   By: Titus Dubin M.D.   On: 04/01/2020 12:46    ASSESSMENT:  [place]  PLAN:    1. HCC: Although patient's AFP is only minimally elevated at 12.1, biopsy confirmed hepatocellular carcinoma. Patient has a PET scan scheduled later this week to complete the staging work-up. He wishes to pursue aggressive treatment and plan on using Tecentriq plus Avastin every 3 weeks until intolerable side effects or progression of disease. Patient will require port placement prior to initiating treatment. Return to clinic in approximately 2 weeks to initiate cycle 1  of treatment.  2.  Weight loss: Likely secondary to underlying malignancy. Patient has been referred to dietary. 3.  Pain: Patient states oxycodone 10 mg too strong and was given a prescription for 5 mg tablets.  I spent a total of 30 minutes reviewing chart data, face-to-face evaluation with the patient, counseling and coordination of care as detailed above.   Patient expressed understanding and was in agreement with this plan. He also understands that He can call clinic at any time with any questions, concerns, or complaints.   Cancer Staging No matching staging information was found for the patient.  Lloyd Huger, MD   04/23/2020 9:55 AM

## 2020-04-22 ENCOUNTER — Other Ambulatory Visit: Payer: Self-pay

## 2020-04-22 ENCOUNTER — Inpatient Hospital Stay: Payer: Medicare HMO

## 2020-04-22 ENCOUNTER — Telehealth: Payer: Self-pay | Admitting: Nurse Practitioner

## 2020-04-22 ENCOUNTER — Encounter: Payer: Self-pay | Admitting: Oncology

## 2020-04-22 ENCOUNTER — Inpatient Hospital Stay (HOSPITAL_BASED_OUTPATIENT_CLINIC_OR_DEPARTMENT_OTHER): Payer: Medicare HMO | Admitting: Oncology

## 2020-04-22 ENCOUNTER — Encounter: Payer: Self-pay | Admitting: Gastroenterology

## 2020-04-22 ENCOUNTER — Other Ambulatory Visit: Payer: Self-pay | Admitting: *Deleted

## 2020-04-22 ENCOUNTER — Ambulatory Visit (INDEPENDENT_AMBULATORY_CARE_PROVIDER_SITE_OTHER): Payer: Medicare HMO | Admitting: Gastroenterology

## 2020-04-22 VITALS — BP 145/73 | HR 83 | Temp 98.1°F | Ht 73.0 in | Wt 155.0 lb

## 2020-04-22 VITALS — BP 154/85 | HR 74 | Temp 97.3°F | Resp 20 | Ht 73.0 in | Wt 155.5 lb

## 2020-04-22 DIAGNOSIS — Z7189 Other specified counseling: Secondary | ICD-10-CM

## 2020-04-22 DIAGNOSIS — C220 Liver cell carcinoma: Secondary | ICD-10-CM | POA: Diagnosis not present

## 2020-04-22 MED ORDER — OXYCODONE HCL 5 MG PO TABS: 5.0000 mg | ORAL_TABLET | Freq: Four times a day (QID) | ORAL | 0 refills | Status: DC | PRN

## 2020-04-22 NOTE — Progress Notes (Deleted)
Cephas Darby, MD [address]  [place]  [place], [place] [address]  Main: [phone]  Fax: [phone]    Gastroenterology Consultation  Referring Provider:     No ref. provider found Primary Care Physician:  Romualdo Bolk, FNP Primary Gastroenterologist:  Dr. Cephas Darby Reason for Consultation:     ***        HPI:   Joshua Ramos is a 69 y.o. male referred by Dr. Romualdo Bolk, FNP  for consultation & management of ***  NSAIDs: ***  Antiplts/Anticoagulants/Anti thrombotics: ***  GI Procedures: ***  Past Medical History:  Diagnosis Date  . Hepatitis A     No past surgical history on file.  Prior to Admission medications   Medication Sig Start Date End Date Taking? Authorizing Provider  Acetaminophen-Codeine 300-30 MG tablet Take 1-2 tablets by mouth every 4 (four) hours as needed. 03/19/20 04/18/21 Yes [provider]  buPROPion (WELLBUTRIN SR) 150 MG 12 hr tablet Take 1 tablet by mouth in the morning and at bedtime. 05/15/15  Yes [provider]  lisinopril-hydrochlorothiazide (ZESTORETIC) 20-12.5 MG tablet Take 1 tablet by mouth daily. 03/19/20 03/19/21 Yes [provider]  naproxen (NAPROSYN) 500 MG tablet Take 500 mg by mouth every 12 (twelve) hours as needed.  07/18/19 07/17/20 Yes [provider]  oxyCODONE (OXY IR/ROXICODONE) 5 MG immediate release tablet Take 1 tablet (5 mg total) by mouth every 6 (six) hours as needed for severe pain. 04/22/20  Yes Lloyd Huger, MD  capsicum (ZOSTRIX) 0.075 % topical cream Apply 1 application topically every 8 (eight) hours.  Patient not taking: Reported on 04/22/2020 08/03/11   [provider]    No family history on file.   Social History   Tobacco Use  . Smoking status: Current Some Day Smoker    Packs/day: 0.50    Types: Cigarettes  . Smokeless tobacco: Never Used  . Tobacco comment: 1 cigarette today  Vaping Use  . Vaping Use: Never used    Substance Use Topics  . Alcohol use: Yes    Comment: occasional drinks  . Drug use: Not on file    Allergies as of 04/22/2020  . (No Known Allergies)    Review of Systems:    All systems reviewed and negative except where noted in HPI.   Physical Exam:  BP (!) 145/73 (BP Location: Left Arm, Patient Position: Sitting, Cuff Size: Normal)   Pulse 83   Temp 98.1 F (36.7 C) (Oral)   Ht 6\' 1"  (7.342 m)   Wt 155 lb (70.3 kg)   BMI 20.45 kg/m  No LMP for male patient.  General:   Alert,  Well-developed, well-nourished, pleasant and cooperative in NAD Head:  Normocephalic and atraumatic. Eyes:  Sclera clear, no icterus.   Conjunctiva pink. Ears:  Normal auditory acuity. Nose:  No deformity, discharge, or lesions. Mouth:  No deformity or lesions,oropharynx pink & moist. Neck:  Supple; no masses or thyromegaly. Lungs:  Respirations even and unlabored.  Clear throughout to auscultation.   No wheezes, crackles, or rhonchi. No acute distress. Heart:  Regular rate and rhythm; no murmurs, clicks, rubs, or gallops. Abdomen:  Normal bowel sounds. Soft, non-tender and non-distended without masses, hepatosplenomegaly or hernias noted.  No guarding or rebound tenderness.   Rectal: Not performed Msk:  Symmetrical without gross deformities. Good, equal movement & strength bilaterally. Pulses:  Normal pulses noted. Extremities:  No clubbing or edema.  No cyanosis. Neurologic:  Alert and oriented x3;  grossly normal neurologically. Skin:  Intact without significant lesions or rashes. No jaundice. Lymph Nodes:  No significant cervical adenopathy. Psych:  Alert and cooperative. Normal mood and affect.  Imaging Studies: ***  Assessment and Plan:   Joshua Ramos is a 69 y.o. male with ***   Follow up in ***   Cephas Darby, MD

## 2020-04-22 NOTE — Telephone Encounter (Signed)
   Joshua Ramos DOB: [birth year] MRN: [id number]   RIDER WAIVER AND RELEASE OF LIABILITY  For purposes of improving physical access to our facilities, [facility] is pleased to partner with third parties to provide [facility] patients or other authorized individuals the option of convenient, on-demand ground transportation services (the [place]") through use of the technology service that enables users to request on-demand ground transportation from independent third-party providers.  By opting to use and accept these [employer], I, the undersigned, hereby agree on behalf of myself, and on behalf of any minor child using the [employer] for whom I am the parent or legal guardian, as follows:  1. Government social research officer provided to me are provided by independent third-party transportation providers who are not [employer] or employees and who are unaffiliated with [employer]. 2. [facility] is neither a transportation carrier nor a common or public carrier. 3. [facility] has no control over the quality or safety of the transportation that occurs as a result of the [employer]. 4. [facility] cannot guarantee that any third-party transportation provider will complete any arranged transportation service. 5. [facility] makes no representation, warranty, or guarantee regarding the reliability, timeliness, quality, safety, suitability, or availability of any of the Transport Services or that they will be error free. 6. I fully understand that traveling by vehicle involves risks and dangers of serious bodily injury, including permanent disability, paralysis, and death. I agree, on behalf of myself and on behalf of any minor child using the Transport Services for whom I am the parent or legal guardian, that the entire risk arising out of my use of the [employer] remains solely with me, to the maximum extent permitted under applicable law. 7. The [employer] are provided "as is" and "as available." [place] disclaims all representations and warranties, express, implied or statutory, not expressly set out in these terms, including the implied warranties of merchantability and fitness for a particular purpose. 8. I hereby waive and release [facility], its agents, employees, officers, directors, representatives, insurers, attorneys, assigns, successors, subsidiaries, and affiliates from any and all past, present, or future claims, demands, liabilities, actions, causes of action, or suits of any kind directly or indirectly arising from acceptance and use of the [employer]. 9. I further waive and release [facility] and its affiliates from all present and future liability and responsibility for any injury or death to persons or damages to property caused by or related to the use of the [employer]. 10. I have read this Waiver and Release of Liability, and I understand the terms used in it and their legal significance. This Waiver is freely and voluntarily given with the understanding that my right (as well as the right of any minor child for whom I am the parent or legal guardian using the [employer]) to legal recourse against  in connection with the [employer] is knowingly surrendered in return for use of these services.   I attest that I read the consent document to Joshua Ramos, gave Mr. Ander the opportunity to ask questions and answered the questions asked (if any). I affirm that Joshua Ramos then provided consent for he's participation in this program.     Joshua Ramos

## 2020-04-22 NOTE — Progress Notes (Signed)
Patient reports some pain in his upper abdomen.  He states he is currently taking oxycodone and would like to discuss another pain medication with provider. He denies any questions or other concerns at this time.

## 2020-04-22 NOTE — Progress Notes (Signed)
Spoke with Thayer Headings, sister, and updated on plan of care. She will also remind him of his hospital follow up with Dr. Marius Ditch today at 1530.

## 2020-04-23 ENCOUNTER — Telehealth: Payer: Self-pay | Admitting: *Deleted

## 2020-04-23 DIAGNOSIS — Z7189 Other specified counseling: Secondary | ICD-10-CM | POA: Insufficient documentation

## 2020-04-23 MED ORDER — PROCHLORPERAZINE MALEATE 10 MG PO TABS: 10.0000 mg | ORAL_TABLET | Freq: Four times a day (QID) | ORAL | 2 refills | Status: DC | PRN

## 2020-04-23 MED ORDER — LIDOCAINE-PRILOCAINE 2.5-2.5 % EX CREA: TOPICAL_CREAM | CUTANEOUS | 3 refills | Status: AC

## 2020-04-23 NOTE — Progress Notes (Signed)
START OFF PATHWAY REGIMEN - Other   [id number]:Atezolizumab 1,200 mg IV D1 + Bevacizumab 15 mg/kg IV D1 q21 Days:   A cycle is every 21 Days:     Atezolizumab      Bevacizumab-xxxx   **Always confirm dose/schedule in your pharmacy ordering system**  Patient Characteristics: Intent of Therapy: Non-Curative / Palliative Intent, Discussed with Patient 

## 2020-04-23 NOTE — Telephone Encounter (Signed)
Patient and sister notified of patients appointment for port placement. Port placement is scheduled for Friday 10/8 at 10:00, patient to arrive at 9:00. Patient and family verbalized understanding of appointment details.

## 2020-04-25 ENCOUNTER — Other Ambulatory Visit: Payer: Self-pay | Admitting: Oncology

## 2020-04-25 DIAGNOSIS — B159 Hepatitis A without hepatic coma: Secondary | ICD-10-CM

## 2020-04-25 HISTORY — DX: Hepatitis a without hepatic coma: B15.9

## 2020-04-29 ENCOUNTER — Ambulatory Visit: Admission: RE | Admit: 2020-04-29 | Payer: Medicare HMO | Source: Ambulatory Visit

## 2020-04-29 ENCOUNTER — Ambulatory Visit
Admission: RE | Admit: 2020-04-29 | Discharge: 2020-04-29 | Disposition: A | Discharge status: Home / Self Care | Payer: Medicare HMO | Source: Ambulatory Visit | Attending: Oncology | Admitting: Oncology

## 2020-04-29 ENCOUNTER — Other Ambulatory Visit: Payer: Self-pay

## 2020-04-29 DIAGNOSIS — R188 Other ascites: Secondary | ICD-10-CM | POA: Diagnosis not present

## 2020-04-29 DIAGNOSIS — K7460 Unspecified cirrhosis of liver: Secondary | ICD-10-CM | POA: Insufficient documentation

## 2020-04-29 DIAGNOSIS — C220 Liver cell carcinoma: Secondary | ICD-10-CM | POA: Diagnosis not present

## 2020-04-29 DIAGNOSIS — R600 Localized edema: Secondary | ICD-10-CM | POA: Insufficient documentation

## 2020-04-29 DIAGNOSIS — R160 Hepatomegaly, not elsewhere classified: Secondary | ICD-10-CM | POA: Diagnosis present

## 2020-04-29 LAB — GLUCOSE, CAPILLARY: Glucose-Capillary: 88 mg/dL (ref 70–99)

## 2020-04-29 IMAGING — PT NM PET TUM IMG INITIAL (PI) SKULL BASE T - THIGH
1 of 10 series · 1 of 25 positions shown · non-contrast
Comparison: Multiple exams, including CT abdomen [DATE]

CLINICAL DATA: Initial treatment strategy for hepatocellular
carcinoma.

EXAM:
NUCLEAR MEDICINE PET SKULL BASE TO THIGH
TECHNIQUE: 8.0 mCi F-18 FDG was injected intravenously. Full-ring PET imaging
was performed from the skull base to thigh after the radiotracer. CT
data was obtained and used for attenuation correction and anatomic
localization.
Fasting blood glucose: 88 mg/dl

[Series 3: ct wb 5.0 b30f · axial · 5.0mm · 0.98mm/px · 1 of 329 slices shown]
[im 329/329  brain]
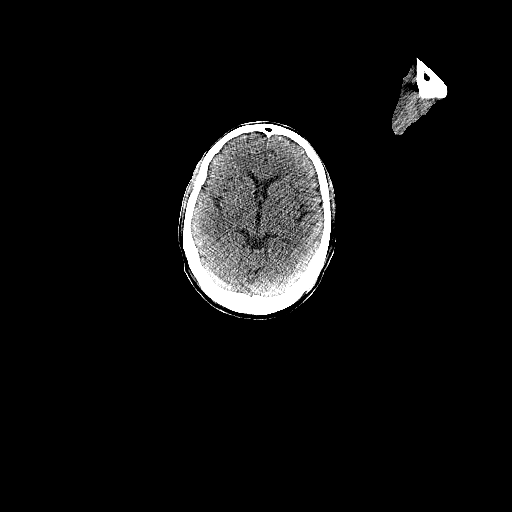

[1 of 25 positions shown; findings below may reference images not displayed]

FINDINGS: Mediastinal blood pool activity: SUV max

Liver activity: SUV max NA

NECK: No significant abnormal hypermetabolic activity in this
region.

Incidental CT findings: Mild chronic left maxillary sinusitis.

CHEST: No significant abnormal hypermetabolic activity in this
region.

Incidental CT findings: Atherosclerotic thoracic aorta. Paraseptal
and centrilobular emphysema.

ABDOMEN/PELVIS: The multifocal hepatocellular carcinoma scattered
throughout the liver has a very similar activity level to the
surrounding hepatic parenchyma. In a representative region of
primarily normal appearing parenchyma on the prior CT, background
hepatic activity has a maximum SUV of about 3.0. In the vicinity of
the lobular tumor from the inferior margin of the left hepatic lobe
which underwent biopsy, maximum SUV is 3.1. In the infiltrated
portion of the lateral segment left hepatic lobe, primarily segment
3, maximum SUV is 3.4. In the vicinity of a tumor in segment 4a of
the liver shown on recent CT abdomen, maximum SUV is 2.8. Aside from
the contour abnormalities in the liver, the tumor is surprisingly
inconspicuous against the background hepatic parenchyma by PET-CT.

Incidental CT findings: Hepatic cirrhosis. Mild ascites with omental
and mesenteric edema. Malignant ascites is not excluded although the
appearance could potentially be from benign third spacing of fluid.

Aortoiliac atherosclerotic vascular disease.

SKELETON: No significant abnormal hypermetabolic activity in this
region.

Incidental CT findings: Chronic appearing anterior wedging at L2.
Spondylosis and degenerative disc disease at L5-S1.
IMPRESSION: 1. The multifocal hepatocellular carcinoma scattered throughout the
hepatic parenchyma has a activity level almost identical to the
surrounding hepatic parenchyma, yielding poor conspicuity against
the background liver activity.
2. Ascites with mesenteric and omental edema. I cannot completely
exclude the possibility of malignant ascites although benign third
spacing of fluid related to underlying cirrhosis is a distinct
possibility as well.
3. Other imaging findings of potential clinical significance:
Chronic left maxillary sinusitis. Aortic Atherosclerosis
([JH]-[JH]). Emphysema ([JH]-[JH]). Hepatic cirrhosis.

## 2020-04-29 MED ORDER — FLUDEOXYGLUCOSE F - 18 (FDG) INJECTION
8.0000 | Freq: Once | INTRAVENOUS | Status: AC | PRN
  Administered 2020-04-29: 8 via INTRAVENOUS

## 2020-04-29 NOTE — Progress Notes (Signed)
Patient on schedule for [facility] 05/02/2120, spoke with patient on phone with pre procedure instructions given. Made aware to be here @ 0900, NPO after MN prior to procedure., and driver for home after discharge, stated understanding.

## 2020-04-30 NOTE — Patient Instructions (Signed)
Atezolizumab injection What is this medicine? ATEZOLIZUMAB (a te zoe LIZ ue mab) is a monoclonal antibody. It is used to treat bladder cancer (urothelial cancer), liver cancer, lung cancer, breast cancer, and melanoma. This medicine may be used for other purposes; ask your health care provider or pharmacist if you have questions. COMMON BRAND NAME(S): Tecentriq What should I tell my health care provider before I take this medicine? They need to know if you have any of these conditions:  diabetes  immune system problems  infection  inflammatory bowel disease  liver disease  lung or breathing disease  lupus  nervous system problems like myasthenia gravis or Guillain-Barre syndrome  organ transplant  an unusual or allergic reaction to atezolizumab, other medicines, foods, dyes, or preservatives  pregnant or trying to get pregnant  breast-feeding How should I use this medicine? This medicine is for infusion into a vein. It is given by a health care professional in a hospital or clinic setting. A special MedGuide will be given to you before each treatment. Be sure to read this information carefully each time. Talk to your pediatrician regarding the use of this medicine in children. Special care may be needed. Overdosage: If you think you have taken too much of this medicine contact a poison control center or emergency room at once. NOTE: This medicine is only for you. Do not share this medicine with others. What if I miss a dose? It is important not to miss your dose. Call your doctor or health care professional if you are unable to keep an appointment. What may interact with this medicine? Interactions have not been studied. This list may not describe all possible interactions. Give your health care provider a list of all the medicines, herbs, non-prescription drugs, or dietary supplements you use. Also tell them if you smoke, drink alcohol, or use illegal drugs. Some items may  interact with your medicine. What should I watch for while using this medicine? Your condition will be monitored carefully while you are receiving this medicine. You may need blood work done while you are taking this medicine. Do not become pregnant while taking this medicine or for at least 5 months after stopping it. Women should inform their doctor if they wish to become pregnant or think they might be pregnant. There is a potential for serious side effects to an unborn child. Talk to your health care professional or pharmacist for more information. Do not breast-feed an infant while taking this medicine or for at least 5 months after the last dose. What side effects may I notice from receiving this medicine? Side effects that you should report to your doctor or health care professional as soon as possible:  allergic reactions like skin rash, itching or hives, swelling of the face, lips, or tongue  black, tarry stools  bloody or watery diarrhea  breathing problems  changes in vision  chest pain or chest tightness  chills  facial flushing  fever  headache  signs and symptoms of high blood sugar such as dizziness; dry mouth; dry skin; fruity breath; nausea; stomach pain; increased hunger or thirst; increased urination  signs and symptoms of liver injury like dark yellow or brown urine; general ill feeling or flu-like symptoms; light-colored stools; loss of appetite; nausea; right upper belly pain; unusually weak or tired; yellowing of the eyes or skin  stomach pain  trouble passing urine or change in the amount of urine Side effects that usually do not require medical attention (report to  your doctor or health care professional if they continue or are bothersome):  bone pain  cough  diarrhea  joint pain  muscle pain  muscle weakness  swelling of arms or legs  tiredness  weight loss This list may not describe all possible side effects. Call your doctor for  medical advice about side effects. You may report side effects to FDA at 1-800-FDA-1088. Where should I keep my medicine? This drug is given in a hospital or clinic and will not be stored at home. NOTE: This sheet is a summary. It may not cover all possible information. If you have questions about this medicine, talk to your doctor, pharmacist, or health care provider.  2020 Elsevier/Gold Standard (2019-03-02 13:11:14) Bevacizumab injection What is this medicine? BEVACIZUMAB (be va SIZ yoo mab) is a monoclonal antibody. It is used to treat many types of cancer. This medicine may be used for other purposes; ask your health care provider or pharmacist if you have questions. COMMON BRAND NAME(S): Avastin, MVASI, Zirabev What should I tell my health care provider before I take this medicine? They need to know if you have any of these conditions:  diabetes  heart disease  high blood pressure  history of coughing up blood  prior anthracycline chemotherapy (e.g., doxorubicin, daunorubicin, epirubicin)  recent or ongoing radiation therapy  recent or planning to have surgery  stroke  an unusual or allergic reaction to bevacizumab, hamster proteins, mouse proteins, other medicines, foods, dyes, or preservatives  pregnant or trying to get pregnant  breast-feeding How should I use this medicine? This medicine is for infusion into a vein. It is given by a health care professional in a hospital or clinic setting. Talk to your pediatrician regarding the use of this medicine in children. Special care may be needed. Overdosage: If you think you have taken too much of this medicine contact a poison control center or emergency room at once. NOTE: This medicine is only for you. Do not share this medicine with others. What if I miss a dose? It is important not to miss your dose. Call your doctor or health care professional if you are unable to keep an appointment. What may interact with this  medicine? Interactions are not expected. This list may not describe all possible interactions. Give your health care provider a list of all the medicines, herbs, non-prescription drugs, or dietary supplements you use. Also tell them if you smoke, drink alcohol, or use illegal drugs. Some items may interact with your medicine. What should I watch for while using this medicine? Your condition will be monitored carefully while you are receiving this medicine. You will need important blood work and urine testing done while you are taking this medicine. This medicine may increase your risk to bruise or bleed. Call your doctor or health care professional if you notice any unusual bleeding. Before having surgery, talk to your health care provider to make sure it is ok. This drug can increase the risk of poor healing of your surgical site or wound. You will need to stop this drug for 28 days before surgery. After surgery, wait at least 28 days before restarting this drug. Make sure the surgical site or wound is healed enough before restarting this drug. Talk to your health care provider if questions. Do not become pregnant while taking this medicine or for 6 months after stopping it. Women should inform their doctor if they wish to become pregnant or think they might be pregnant. There is a  potential for serious side effects to an unborn child. Talk to your health care professional or pharmacist for more information. Do not breast-feed an infant while taking this medicine and for 6 months after the last dose. This medicine has caused ovarian failure in some women. This medicine may interfere with the ability to have a child. You should talk to your doctor or health care professional if you are concerned about your fertility. What side effects may I notice from receiving this medicine? Side effects that you should report to your doctor or health care professional as soon as possible:  allergic reactions like skin  rash, itching or hives, swelling of the face, lips, or tongue  chest pain or chest tightness  chills  coughing up blood  high fever  seizures  severe constipation  signs and symptoms of bleeding such as bloody or black, tarry stools; red or dark-brown urine; spitting up blood or brown material that looks like coffee grounds; red spots on the skin; unusual bruising or bleeding from the eye, gums, or nose  signs and symptoms of a blood clot such as breathing problems; chest pain; severe, sudden headache; pain, swelling, warmth in the leg  signs and symptoms of a stroke like changes in vision; confusion; trouble speaking or understanding; severe headaches; sudden numbness or weakness of the face, arm or leg; trouble walking; dizziness; loss of balance or coordination  stomach pain  sweating  swelling of legs or ankles  vomiting  weight gain Side effects that usually do not require medical attention (report to your doctor or health care professional if they continue or are bothersome):  back pain  changes in taste  decreased appetite  dry skin  nausea  tiredness This list may not describe all possible side effects. Call your doctor for medical advice about side effects. You may report side effects to FDA at 1-800-FDA-1088. Where should I keep my medicine? This drug is given in a hospital or clinic and will not be stored at home. NOTE: This sheet is a summary. It may not cover all possible information. If you have questions about this medicine, talk to your doctor, pharmacist, or health care provider.  2020 Elsevier/Gold Standard (2019-05-09 10:50:46)

## 2020-05-01 ENCOUNTER — Other Ambulatory Visit: Payer: Self-pay

## 2020-05-01 ENCOUNTER — Inpatient Hospital Stay: Payer: Medicare HMO

## 2020-05-01 ENCOUNTER — Other Ambulatory Visit: Payer: Self-pay | Admitting: Student

## 2020-05-01 ENCOUNTER — Inpatient Hospital Stay: Payer: Medicare HMO | Attending: Nurse Practitioner | Admitting: Nurse Practitioner

## 2020-05-01 DIAGNOSIS — Z79899 Other long term (current) drug therapy: Secondary | ICD-10-CM | POA: Insufficient documentation

## 2020-05-01 DIAGNOSIS — Z5111 Encounter for antineoplastic chemotherapy: Secondary | ICD-10-CM | POA: Diagnosis present

## 2020-05-01 DIAGNOSIS — C220 Liver cell carcinoma: Secondary | ICD-10-CM | POA: Insufficient documentation

## 2020-05-01 NOTE — Progress Notes (Signed)
[place]  [phone] [phone]  ID: Joshua Ramos OB: 11-20-1950  MR#: [id number]  GOV#:[id number]  Patient Care Team: Romualdo Bolk, FNP as PCP - General (Nurse Practitioner) Clent Jacks, RN as Oncology Nurse Navigator  CHIEF COMPLAINT: Stage IIIa [facility]  INTERVAL HISTORY: Patient returns to clinic today for further evaluation and initiation of Tecentriq.  Avastin is on hold secondary to possible Y 90 ablation in the future.  He continues to have mild flank pain, but otherwise feels well. His appetite has improved and his weight is stable. He continues to have right leg weakness.  He has no other neurologic complaints.  He denies any recent fevers or illnesses.  He has no chest pain, shortness of breath, cough, or hemoptysis. He denies any abdominal pain. He denies any nausea, vomiting, constipation, or diarrhea.  He has occasional urinary retention.  Patient offers no further specific complaints today.  REVIEW OF SYSTEMS:   Review of Systems  Constitutional: Negative.  Negative for fever, malaise/fatigue and weight loss.  Respiratory: Negative.  Negative for cough and shortness of breath.   Cardiovascular: Negative.  Negative for chest pain and leg swelling.  Gastrointestinal: Negative.  Negative for abdominal pain, blood in stool, constipation, nausea and vomiting.  Genitourinary: Positive for flank pain.  Musculoskeletal: Negative for back pain.  Skin: Negative.  Negative for rash.  Neurological: Positive for focal weakness. Negative for dizziness, weakness and headaches.  Psychiatric/Behavioral: Negative.  The patient is not nervous/anxious.     As per HPI. Otherwise, a complete review of systems is negative.  PAST MEDICAL HISTORY: Past Medical History:  Diagnosis Date  . Cancer ([place])    liver  . Hepatitis A   . Hypertension     PAST SURGICAL HISTORY: Past Surgical History:  Procedure Laterality Date  . IR IMAGING  GUIDED PORT INSERTION  05/02/2020    FAMILY HISTORY: No family history on file.  ADVANCED DIRECTIVES (Y/N):  N  HEALTH MAINTENANCE: Social History   Tobacco Use  . Smoking status: Current Some Day Smoker    Packs/day: 0.50    Types: Cigarettes  . Smokeless tobacco: Never Used  . Tobacco comment: 1 cigarette today  Vaping Use  . Vaping Use: Never used  Substance Use Topics  . Alcohol use: Not Currently  . Drug use: Not on file     Colonoscopy:  PAP:  Bone density:  Lipid panel:  No Known Allergies  Current Outpatient Medications  Medication Sig Dispense Refill  . buPROPion (WELLBUTRIN SR) 150 MG 12 hr tablet Take 1 tablet by mouth in the morning and at bedtime.    . lidocaine-prilocaine (EMLA) cream Apply to affected area once 30 g 3  . lisinopril-hydrochlorothiazide (ZESTORETIC) 20-12.5 MG tablet Take 1 tablet by mouth daily.    Marland Kitchen oxyCODONE (OXY IR/ROXICODONE) 5 MG immediate release tablet Take 1 tablet (5 mg total) by mouth every 6 (six) hours as needed for severe pain. 30 tablet 0  . prochlorperazine (COMPAZINE) 10 MG tablet Take 1 tablet (10 mg total) by mouth every 6 (six) hours as needed (Nausea or vomiting). 60 tablet 2  . capsicum (ZOSTRIX) 0.075 % topical cream Apply 1 application topically every 8 (eight) hours.  (Patient not taking: Reported on 04/22/2020)    . naproxen (NAPROSYN) 500 MG tablet Take 500 mg by mouth every 12 (twelve) hours as needed.  (Patient not taking: Reported on 05/02/2020)     No current facility-administered medications for this visit.  Facility-Administered Medications Ordered in Other Visits  Medication Dose Route Frequency Provider Last Rate Last Admin  . 0.9 %  sodium chloride infusion   Intravenous Once Lloyd Huger, MD      . Huey Bienenstock [facility]) 1,200 mg in sodium chloride 0.9 % 250 mL chemo infusion  1,200 mg Intravenous Once Lloyd Huger, MD      . heparin lock flush 100 unit/mL  500 Units Intravenous Once  Lloyd Huger, MD        OBJECTIVE: Vitals:   05/06/20 1513  BP: 135/77  Pulse: 76  Resp: 18  Temp: (!) 97.3 F (36.3 C)  SpO2: 100%     Body mass index is 20.32 kg/m.    ECOG FS:1 - Symptomatic but completely ambulatory  General: Thin, no acute distress. Eyes: Pink conjunctiva, anicteric sclera. HEENT: Normocephalic, moist mucous membranes. Lungs: No audible wheezing or coughing. Heart: Regular rate and rhythm. Abdomen: Soft, nontender, no obvious distention. Musculoskeletal: No edema, cyanosis, or clubbing. Neuro: Alert, answering all questions appropriately. Cranial nerves grossly intact. Skin: No rashes or petechiae noted. Psych: Normal affect.   LAB RESULTS:  Lab Results  Component Value Date   NA 136 05/07/2020   K 4.1 05/07/2020   CL 104 05/07/2020   CO2 26 05/07/2020   GLUCOSE 99 05/07/2020   BUN 13 05/07/2020   CREATININE 1.08 05/07/2020   CALCIUM 8.2 (L) 05/07/2020   PROT 7.2 05/07/2020   ALBUMIN 2.1 (L) 05/07/2020   AST 79 (H) 05/07/2020   ALT 31 05/07/2020   ALKPHOS 123 05/07/2020   BILITOT 1.7 (H) 05/07/2020   GFRNONAA >60 05/07/2020   GFRAA >60 04/04/2020    Lab Results  Component Value Date   WBC 7.9 05/07/2020   NEUTROABS 5.1 05/07/2020   HGB 13.4 05/07/2020   HCT 37.4 (L) 05/07/2020   MCV 94.9 05/07/2020   PLT 177 05/07/2020     STUDIES: NM PET Image Initial (PI) Skull Base To Thigh  Result Date: 04/29/2020 CLINICAL DATA:  Initial treatment strategy for hepatocellular carcinoma. EXAM: NUCLEAR MEDICINE PET SKULL BASE TO THIGH TECHNIQUE: 8.0 mCi F-18 FDG was injected intravenously. Full-ring PET imaging was performed from the skull base to thigh after the radiotracer. CT data was obtained and used for attenuation correction and anatomic localization. Fasting blood glucose: 88 mg/dl COMPARISON:  Multiple exams, including CT abdomen 04/01/2020 FINDINGS: Mediastinal blood pool activity: SUV max 2.1 Liver activity: SUV max NA NECK: No  significant abnormal hypermetabolic activity in this region. Incidental CT findings: Mild chronic left maxillary sinusitis. CHEST: No significant abnormal hypermetabolic activity in this region. Incidental CT findings: Atherosclerotic thoracic aorta. Paraseptal and centrilobular emphysema. ABDOMEN/PELVIS: The multifocal hepatocellular carcinoma scattered throughout the liver has a very similar activity level to the surrounding hepatic parenchyma. In a representative region of primarily normal appearing parenchyma on the prior CT, background hepatic activity has a maximum SUV of about 3.0. In the vicinity of the lobular tumor from the inferior margin of the left hepatic lobe which underwent biopsy, maximum SUV is 3.1. In the infiltrated portion of the lateral segment left hepatic lobe, primarily segment 3, maximum SUV is 3.4. In the vicinity of a tumor in segment 4a of the liver shown on recent CT abdomen, maximum SUV is 2.8. Aside from the contour abnormalities in the liver, the tumor is surprisingly inconspicuous against the background hepatic parenchyma by PET-CT. Incidental CT findings: Hepatic cirrhosis. Mild ascites with omental and mesenteric edema. Malignant ascites is not excluded  although the appearance could potentially be from benign third spacing of fluid. Aortoiliac atherosclerotic vascular disease. SKELETON: No significant abnormal hypermetabolic activity in this region. Incidental CT findings: Chronic appearing anterior wedging at L2. Spondylosis and degenerative disc disease at L5-S1. IMPRESSION: 1. The multifocal hepatocellular carcinoma scattered throughout the hepatic parenchyma has a activity level almost identical to the surrounding hepatic parenchyma, yielding poor conspicuity against the background liver activity. 2. Ascites with mesenteric and omental edema. I cannot completely exclude the possibility of malignant ascites although benign third spacing of fluid related to underlying cirrhosis  is a distinct possibility as well. 3. Other imaging findings of potential clinical significance: Chronic left maxillary sinusitis. Aortic Atherosclerosis (ICD10-I70.0). Emphysema (ICD10-J43.9). Hepatic cirrhosis. Electronically Signed   By: Van Clines M.D.   On: 04/29/2020 17:05   US BIOPSY (LIVER)  Result Date: 04/15/2020 INDICATION: Hepatitis-C, cirrhosis and multiple hepatic masses. EXAM: ULTRASOUND GUIDED CORE BIOPSY OF LIVER MASS MEDICATIONS: None. ANESTHESIA/SEDATION: Fentanyl 50 mcg IV; Versed 1.0 mg IV Moderate Sedation Time:  14 minutes. The patient was continuously monitored during the procedure by the interventional radiology nurse under my direct supervision. PROCEDURE: The procedure, risks, benefits, and alternatives were explained to the patient. Questions regarding the procedure were encouraged and answered. The patient understands and consents to the procedure. Ultrasound was performed of the liver. The abdominal wall was prepped with chlorhexidine in a sterile fashion, and a sterile drape was applied covering the operative field. A sterile gown and sterile gloves were used for the procedure. Local anesthesia was provided with 1% Lidocaine. Under ultrasound guidance, a 17 gauge trocar needle was advanced to the level of a hepatic mass. After confirming needle tip position, coaxial 18 gauge core biopsy samples were obtained. Four samples were obtained and submitted in formalin. A slurry of Gel-Foam pledgets was then injected through the outer needle as it was retracted and removed. COMPLICATIONS: None immediate. FINDINGS: Well-circumscribed rounded exophytic mass is seen off of the inferior aspect of the left lobe measuring approximately 5.2 x 4.8 x 4.3 cm. More ill-defined parenchymal abnormality is also present above the level of the rounded tumor mass within the left lobe. The more discrete mass was targeted for tissue sampling. Solid tissue samples were obtained. Post biopsy imaging  demonstrates no hemorrhage around the mass after biopsy. IMPRESSION: Ultrasound-guided core biopsy performed at the level of an exophytic mass off of the left lobe of the liver measuring just over 5 cm in greatest diameter. Electronically Signed   By: Aletta Edouard M.D.   On: 04/15/2020 13:17   IR IMAGING GUIDED PORT INSERTION  Result Date: 05/02/2020 INDICATION: Recent diagnosis of hepatocellular carcinoma. In need of durable intravenous access for chemotherapy administration. EXAM: IMPLANTED PORT A CATH PLACEMENT WITH ULTRASOUND AND FLUOROSCOPIC GUIDANCE COMPARISON:  None. MEDICATIONS: Ancef 3 gm IV; The antibiotic was administered within an appropriate time interval prior to skin puncture. ANESTHESIA/SEDATION: Moderate (conscious) sedation was employed during this procedure. A total of Versed 1 mg and Fentanyl 50 mcg was administered intravenously. Moderate Sedation Time: 23 minutes. The patient's level of consciousness and vital signs were monitored continuously by radiology nursing throughout the procedure under my direct supervision. CONTRAST:  None FLUOROSCOPY TIME:  18 seconds COMPLICATIONS: None immediate. PROCEDURE: The procedure, risks, benefits, and alternatives were explained to the patient. Questions regarding the procedure were encouraged and answered. The patient understands and consents to the procedure. The right neck and chest were prepped with chlorhexidine in a sterile fashion, and a sterile drape  was applied covering the operative field. Maximum barrier sterile technique with sterile gowns and gloves were used for the procedure. A timeout was performed prior to the initiation of the procedure. Local anesthesia was provided with 1% lidocaine with epinephrine. After creating a small venotomy incision, a micropuncture kit was utilized to access the internal jugular vein. Real-time ultrasound guidance was utilized for vascular access including the acquisition of a permanent ultrasound image  documenting patency of the accessed vessel. The microwire was utilized to measure appropriate catheter length. A subcutaneous port pocket was then created along the upper chest wall utilizing a combination of sharp and blunt dissection. The pocket was irrigated with sterile saline. A single lumen "standard sized" power injectable port was chosen for placement. The 8 Fr catheter was tunneled from the port pocket site to the venotomy incision. The port was placed in the pocket. The external catheter was trimmed to appropriate length. At the venotomy, an 8 Fr peel-away sheath was placed over a guidewire under fluoroscopic guidance. The catheter was then placed through the sheath and the sheath was removed. Final catheter positioning was confirmed and documented with a fluoroscopic spot radiograph. The port was accessed with a Huber needle, aspirated and flushed with heparinized saline. The port pocket incision was closed with interrupted 2-0 Vicryl suture. The skin was opposed with a running subcuticular 4-0 Vicryl suture. Dermabond and Steri-strips were applied to both incisions. Dressings were applied. The patient tolerated the procedure well without immediate post procedural complication. FINDINGS: After catheter placement, the tip lies within the superior cavoatrial junction. The catheter aspirates and flushes normally and is ready for immediate use. IMPRESSION: Successful placement of a right internal jugular approach power injectable Port-A-Cath. The catheter is ready for immediate use. Electronically Signed   By: Sandi Mariscal M.D.   On: 05/02/2020 11:30    ASSESSMENT: Stage IIIa HCC  PLAN:    1. Stage IIIa HCC: Although patient's AFP is only minimally elevated at 12.1, biopsy confirmed hepatocellular carcinoma.  PET scan results reviewed independently and report as above confirming stage of disease. He wishes to pursue aggressive treatment and plan on using Tecentriq plus Avastin every 3 weeks until  intolerable side effects or progression of disease.  Patient is now had port placement.  Will hold Avastin for the first several cycles and refer patient back to interventional radiology for possible Y 90 ablation of his dominant liver mass.  Return to clinic in 3 weeks for further evaluation and consideration of cycle 2. 2.  Weight loss: Likely secondary to underlying malignancy. Patient has been referred to dietary. 3.  Pain: Continue 5 mg oxycodone as needed.  I spent a total of 30 minutes reviewing chart data, face-to-face evaluation with the patient, counseling and coordination of care as detailed above.    Patient expressed understanding and was in agreement with this plan. He also understands that He can call clinic at any time with any questions, concerns, or complaints.   Cancer Staging Hepatocellular carcinoma [facility]) Staging form: Liver, AJCC 8th Edition - Clinical stage from 05/01/2020: Stage IIIA (cT3, cN0, cM0) - Signed by Lloyd Huger, MD on 05/01/2020   Lloyd Huger, MD   05/07/2020 10:40 AM

## 2020-05-02 ENCOUNTER — Other Ambulatory Visit: Payer: Self-pay | Admitting: Nurse Practitioner

## 2020-05-02 ENCOUNTER — Telehealth: Payer: Self-pay | Admitting: *Deleted

## 2020-05-02 ENCOUNTER — Other Ambulatory Visit: Payer: Self-pay

## 2020-05-02 ENCOUNTER — Ambulatory Visit
Admission: RE | Admit: 2020-05-02 | Discharge: 2020-05-02 | Disposition: A | Discharge status: Home / Self Care | Payer: Medicare HMO | Source: Ambulatory Visit | Attending: Oncology | Admitting: Oncology

## 2020-05-02 DIAGNOSIS — Z8619 Personal history of other infectious and parasitic diseases: Secondary | ICD-10-CM | POA: Diagnosis not present

## 2020-05-02 DIAGNOSIS — F17210 Nicotine dependence, cigarettes, uncomplicated: Secondary | ICD-10-CM | POA: Diagnosis not present

## 2020-05-02 DIAGNOSIS — Z79899 Other long term (current) drug therapy: Secondary | ICD-10-CM | POA: Diagnosis not present

## 2020-05-02 DIAGNOSIS — C220 Liver cell carcinoma: Secondary | ICD-10-CM

## 2020-05-02 HISTORY — DX: Malignant (primary) neoplasm, unspecified: C80.1

## 2020-05-02 HISTORY — PX: IR IMAGING GUIDED PORT INSERTION: IMG5740

## 2020-05-02 HISTORY — DX: Essential (primary) hypertension: I10

## 2020-05-02 IMAGING — XA IR IMAGING GUIDED PORT INSERTION
3 series · 7 of 7 positions shown · non-contrast
Comparison: None.

INDICATION: Recent diagnosis of hepatocellular carcinoma. In need of durable
intravenous access for chemotherapy administration.

EXAM:
IMPLANTED PORT A CATH PLACEMENT WITH ULTRASOUND AND FLUOROSCOPIC
GUIDANCE

[Series 1: fluoroscopy - stored · 4 of 52 frames shown]
[frame 1/52]
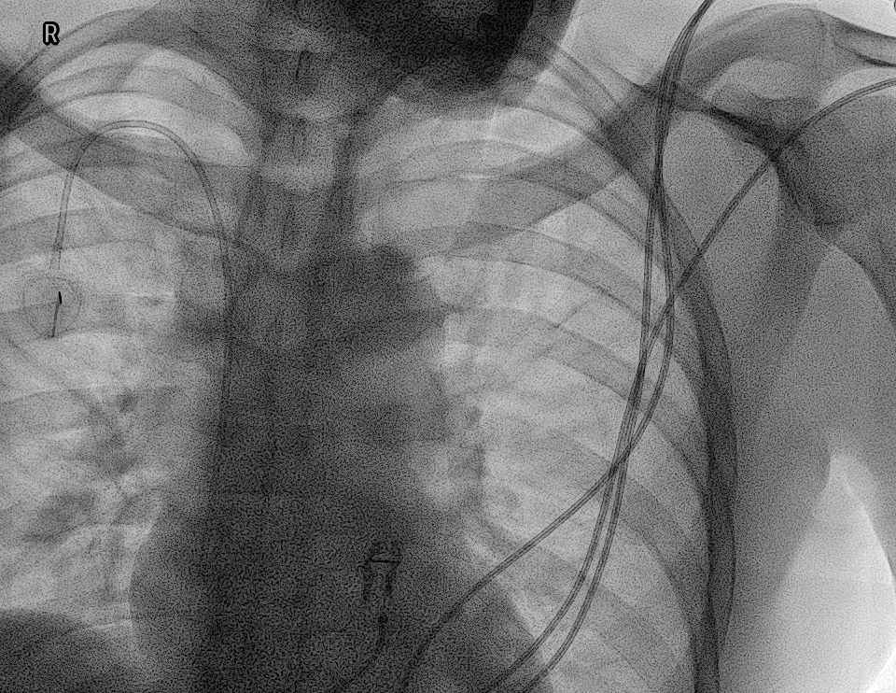
[frame 8/52]
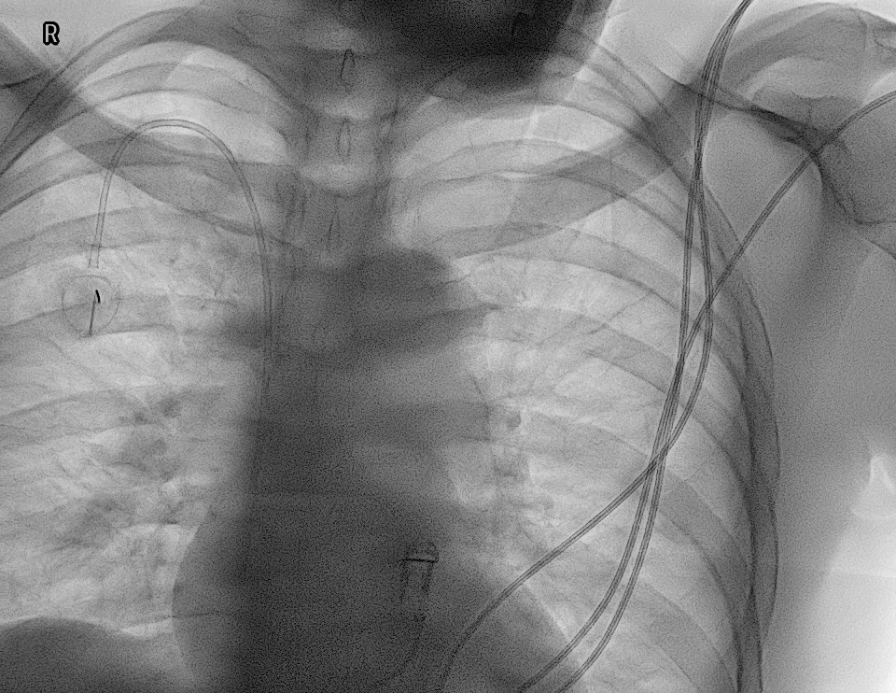
[frame 27/52]
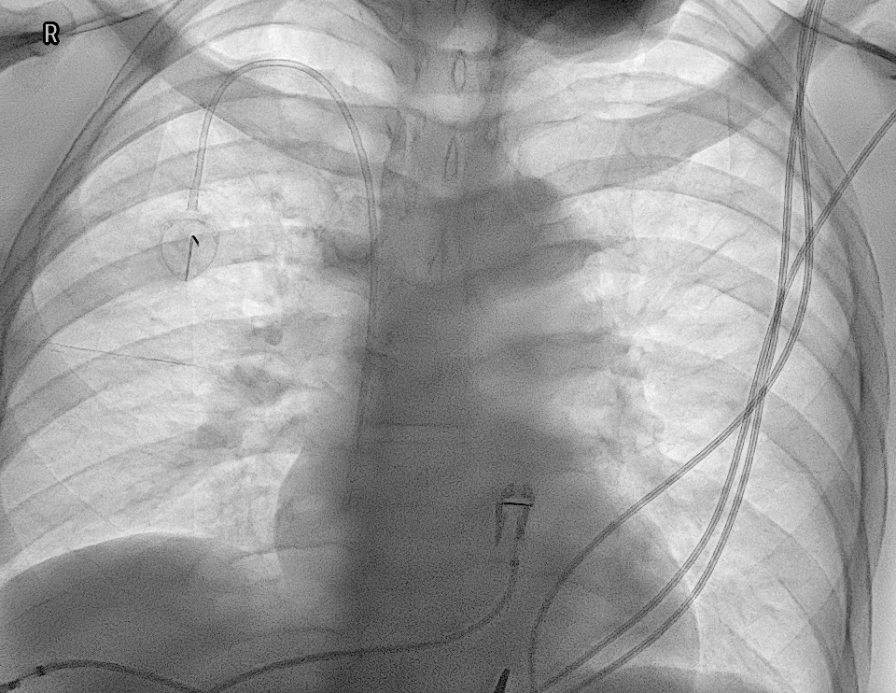
[frame 45/52]
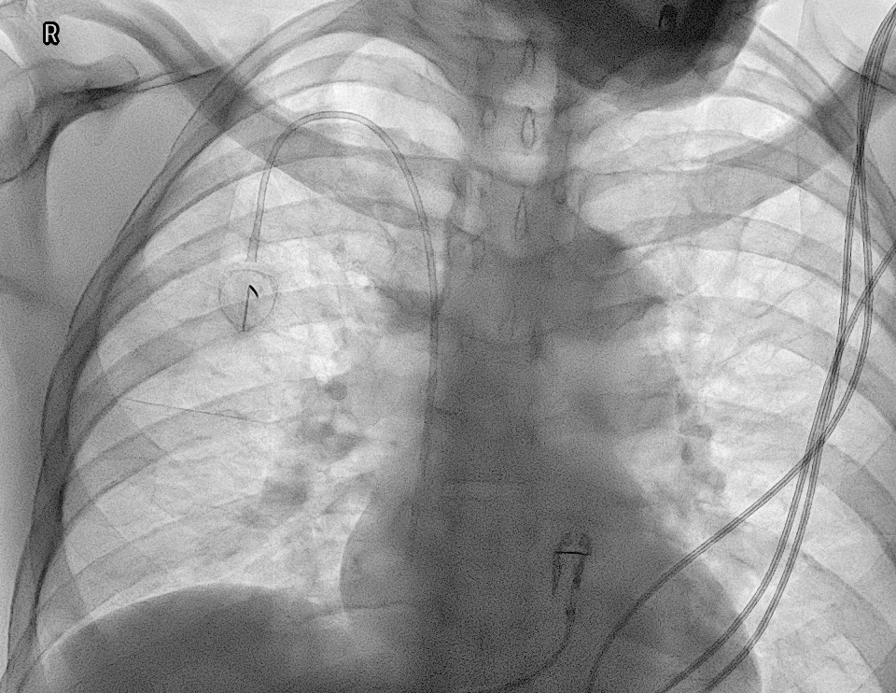

[Series 1: ir fluoro/shunt/fist · 2 of 2 slices shown]
[im 1/2]
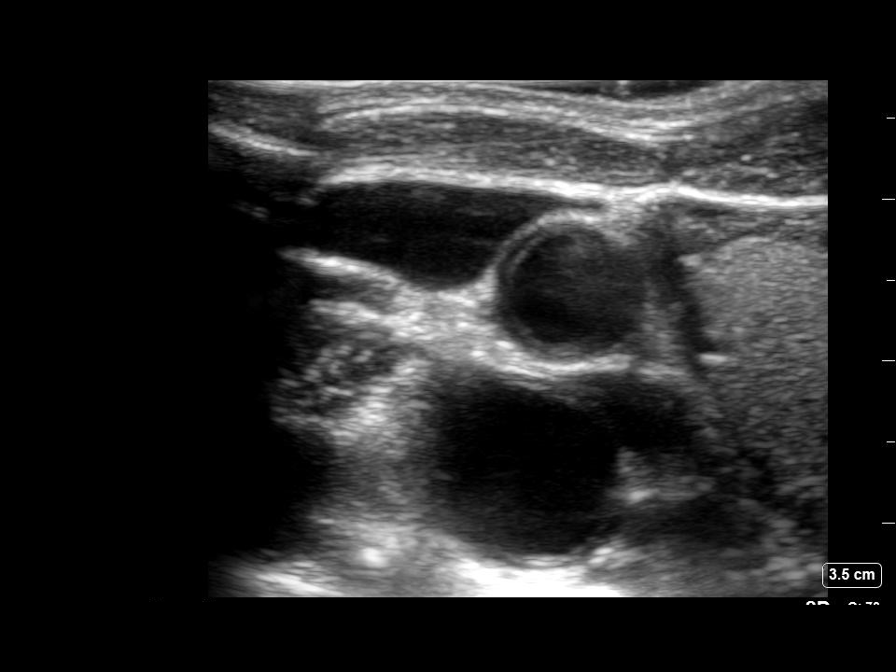
[im 2/2]
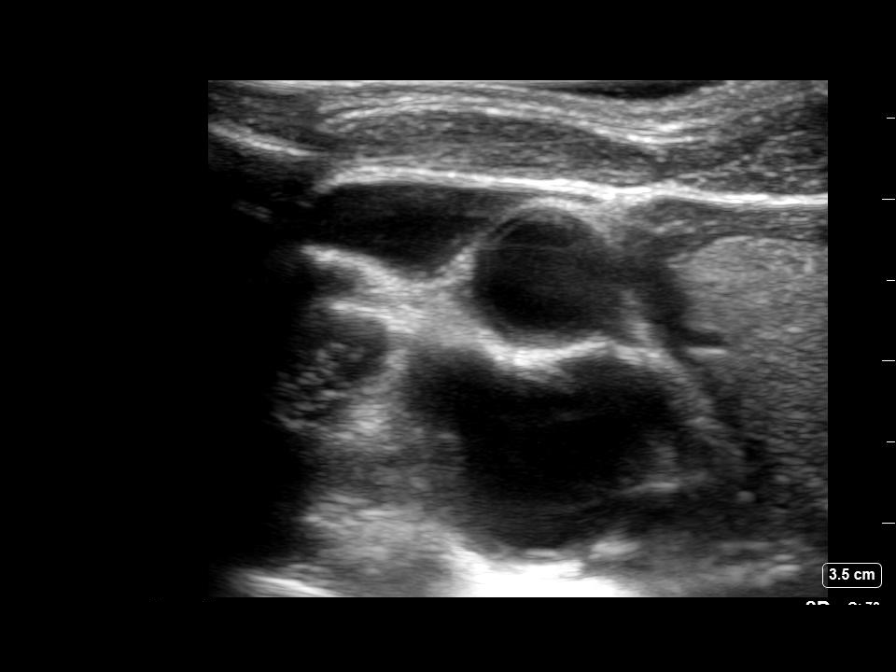

[Series 2: subclavian 3 fps · 1 of 1 slices shown]
[im 1/1]
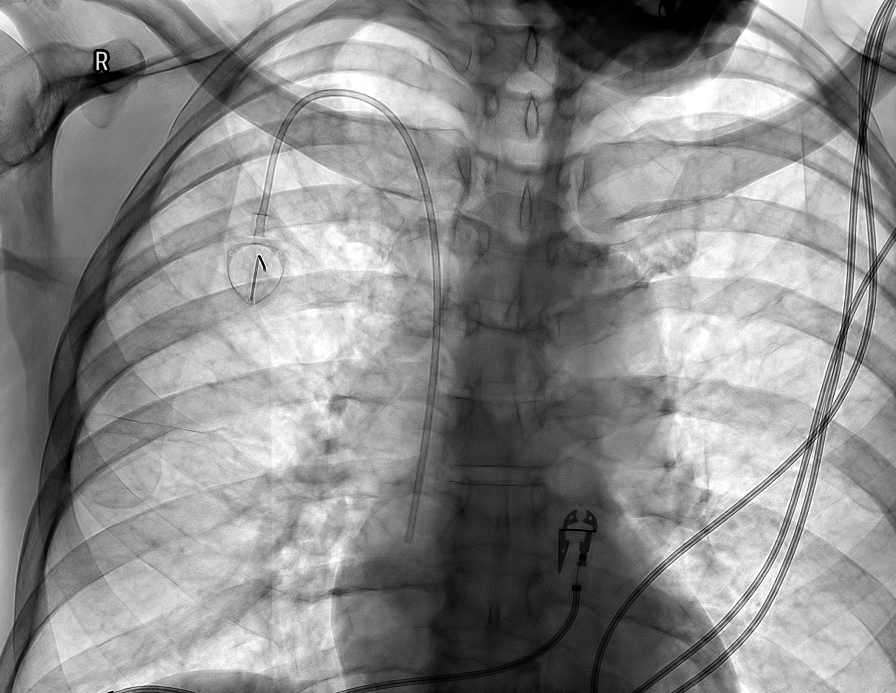

[7 of 7 positions shown; findings below may reference images not displayed]

MEDICATIONS:
Ancef 3 gm IV; The antibiotic was administered within an appropriate
time interval prior to skin puncture.

ANESTHESIA/SEDATION:
Moderate (conscious) sedation was employed during this procedure. A
total of Versed 1 mg and Fentanyl 50 mcg was administered
intravenously.

Moderate Sedation Time: 23 minutes. The patient's level of
consciousness and vital signs were monitored continuously by
radiology nursing throughout the procedure under my direct
supervision.

CONTRAST:  None

FLUOROSCOPY TIME:  18 seconds

COMPLICATIONS:
None immediate.

PROCEDURE:
The procedure, risks, benefits, and alternatives were explained to
the patient. Questions regarding the procedure were encouraged and
answered. The patient understands and consents to the procedure.

The right neck and chest were prepped with chlorhexidine in a
sterile fashion, and a sterile drape was applied covering the
operative field. Maximum barrier sterile technique with sterile
gowns and gloves were used for the procedure. A timeout was
performed prior to the initiation of the procedure. Local anesthesia
was provided with 1% lidocaine with epinephrine.

After creating a small venotomy incision, a micropuncture kit was
utilized to access the internal jugular vein. Real-time ultrasound
guidance was utilized for vascular access including the acquisition
of a permanent ultrasound image documenting patency of the accessed
vessel. The microwire was utilized to measure appropriate catheter
length.

A subcutaneous port pocket was then created along the upper chest
wall utilizing a combination of sharp and blunt dissection. The
pocket was irrigated with sterile saline. A single lumen "standard
sized" power injectable port was chosen for placement. The 8 Fr
catheter was tunneled from the port pocket site to the venotomy
incision. The port was placed in the pocket. The external catheter
was trimmed to appropriate length. At the venotomy, an 8 Fr
peel-away sheath was placed over a guidewire under fluoroscopic
guidance. The catheter was then placed through the sheath and the
sheath was removed. Final catheter positioning was confirmed and
documented with a fluoroscopic spot radiograph. The port was
accessed with GILLES needle, aspirated and flushed with heparinized
saline.

The port pocket incision was closed with interrupted 2-0 Vicryl
suture. The skin was opposed with a running subcuticular 4-0 Vicryl
suture. Dermabond and GILLES were applied to both incisions.
Dressings were applied. The patient tolerated the procedure well
without immediate post procedural complication.
FINDINGS: After catheter placement, the tip lies within the superior
cavoatrial junction. The catheter aspirates and flushes normally and
is ready for immediate use.
IMPRESSION: Successful placement of a right internal jugular approach power
injectable Port-A-Cath. The catheter is ready for immediate use.

## 2020-05-02 MED ORDER — MIDAZOLAM HCL 5 MG/5ML IJ SOLN
INTRAMUSCULAR | Status: AC | PRN
  Administered 2020-05-02 (×2): 1 mg via INTRAVENOUS

## 2020-05-02 MED ORDER — FENTANYL CITRATE (PF) 100 MCG/2ML IJ SOLN
INTRAMUSCULAR | Status: AC
  Filled 2020-05-02: qty 2

## 2020-05-02 MED ORDER — FENTANYL CITRATE (PF) 100 MCG/2ML IJ SOLN
INTRAMUSCULAR | Status: AC | PRN
  Administered 2020-05-02 (×2): 50 ug via INTRAVENOUS

## 2020-05-02 MED ORDER — CEFAZOLIN SODIUM-DEXTROSE 2-4 GM/100ML-% IV SOLN: 2.0000 g | INTRAVENOUS | Status: DC

## 2020-05-02 MED ORDER — CEFAZOLIN SODIUM-DEXTROSE 2-4 GM/100ML-% IV SOLN
INTRAVENOUS | Status: AC
  Administered 2020-05-02: 2 g
  Filled 2020-05-02: qty 100

## 2020-05-02 MED ORDER — SODIUM CHLORIDE 0.9 % IV SOLN: INTRAVENOUS | Status: DC

## 2020-05-02 MED ORDER — OXYCODONE HCL 5 MG PO TABS
5.0000 mg | ORAL_TABLET | Freq: Four times a day (QID) | ORAL | 0 refills | Status: DC | PRN
Start: 2020-05-02 — End: 2020-05-15

## 2020-05-02 MED ORDER — MIDAZOLAM HCL 5 MG/5ML IJ SOLN
INTRAMUSCULAR | Status: AC
  Filled 2020-05-02: qty 5

## 2020-05-02 NOTE — Telephone Encounter (Signed)
Patient called requesting a refill of his Oxycodone and Tylenol #3 He states that he has been taking both of these prescriptions. He only has 2 Tylenol #3 left and 1 Oxycodone tablet left. Please advise

## 2020-05-02 NOTE — Sedation Documentation (Signed)
Total conscious sedation: Versed 1 mg IV, Fentanyl 50 mcg IV. Ancef 2 GM IVPB. Pt. Tolerated procedure well.

## 2020-05-02 NOTE — Progress Notes (Signed)
[place]  [phone] [phone]  Patient Care Team: Romualdo Bolk, FNP as PCP - General (Nurse Practitioner) Clent Jacks, RN as Oncology Nurse Navigator   Name of the patient: Joshua Ramos  [id number]  1950/12/30   Date of visit: 05/02/20  Diagnosis- [place]  Chief complaint/Reason for visit- Initial Meeting for [facility], preparing for starting chemotherapy  Heme/Onc history:  Oncology History  Hepatocellular carcinoma ([place])  04/18/2020 Initial Diagnosis   Hepatocellular carcinoma ([place])   04/29/2020 -  Chemotherapy   The patient had atezolizumab (TECENTRIQ) 1,200 mg in sodium chloride 0.9 % 250 mL chemo infusion, 1,200 mg, Intravenous, Once, 0 of 6 cycles bevacizumab-bvzr (ZIRABEV) 1,100 mg in sodium chloride 0.9 % 100 mL chemo infusion, 15 mg/kg = 1,100 mg, Intravenous,  Once, 0 of 5 cycles  for chemotherapy treatment.    05/01/2020 Cancer Staging   Staging form: Liver, AJCC 8th Edition - Clinical stage from 05/01/2020: Stage IIIA (cT3, cN0, cM0) - Signed by Lloyd Huger, MD on 05/01/2020     Interval history-   who presents to chemo care clinic today for initial meeting in preparation for starting chemotherapy. I introduced the chemo care clinic and we discussed that the role of the clinic is to assist those who are at an increased risk of emergency room visits and/or complications during the course of chemotherapy treatment. We discussed that the increased risk takes into account factors such as age, performance status, and co-morbidities. We also discussed that for some, this might include barriers to care such as not having a primary care provider, lack of insurance/transportation, or not being able to afford medications. We discussed that the goal of the program is to help prevent unplanned ER visits and help reduce complications during chemotherapy. We do this by discussing  specific risk factors to each individual and identifying ways that we can help improve these risk factors and reduce barriers to care.   Review of systems- Review of Systems  Constitutional: Negative for chills, fever, malaise/fatigue and weight loss.  HENT: Negative for hearing loss, nosebleeds, sore throat and tinnitus.   Eyes: Negative for blurred vision and double vision.  Respiratory: Negative for cough, hemoptysis, shortness of breath and wheezing.   Cardiovascular: Negative for chest pain, palpitations and leg swelling.  Gastrointestinal: Positive for abdominal pain. Negative for blood in stool, constipation, diarrhea, melena, nausea and vomiting.  Genitourinary: Negative for dysuria and urgency.  Musculoskeletal: Negative for back pain, falls, joint pain and myalgias.  Skin: Negative for itching and rash.  Neurological: Negative for dizziness, tingling, sensory change, loss of consciousness, weakness and headaches.  Endo/Heme/Allergies: Negative for environmental allergies. Does not bruise/bleed easily.  Psychiatric/Behavioral: Negative for depression. The patient is not nervous/anxious and does not have insomnia.      No Known Allergies  Past Medical History:  Diagnosis Date  . Cancer ([place])    liver  . Hepatitis A   . Hypertension     Past Surgical History:  Procedure Laterality Date  . IR IMAGING GUIDED PORT INSERTION  05/02/2020    Social History   Socioeconomic History  . Marital status: Single    Spouse name: Not on file  . Number of children: 0  . Years of education: Not on file  . Highest education level: Not on file  Occupational History  . Occupation: retired  Tobacco Use  . Smoking status: Current Some Day Smoker  Packs/day: 0.50    Types: Cigarettes  . Smokeless tobacco: Never Used  . Tobacco comment: 1 cigarette today  Vaping Use  . Vaping Use: Never used  Substance and Sexual Activity  . Alcohol use: Not Currently  . Drug use: Not on file  .  Sexual activity: Not on file  Other Topics Concern  . Not on file  Social History Narrative   Lives by himself   Social Determinants of Health   Financial Resource Strain:   . Difficulty of Paying Living Expenses: Not on file  Food Insecurity:   . Worried About Charity fundraiser in the Last Year: Not on file  . Ran Out of Food in the Last Year: Not on file  Transportation Needs:   . Lack of Transportation (Medical): Not on file  . Lack of Transportation (Non-Medical): Not on file  Physical Activity:   . Days of Exercise per Week: Not on file  . Minutes of Exercise per Session: Not on file  Stress:   . Feeling of Stress : Not on file  Social Connections:   . Frequency of Communication with Friends and Family: Not on file  . Frequency of Social Gatherings with Friends and Family: Not on file  . Attends Religious Services: Not on file  . Active Member of Clubs or Organizations: Not on file  . Attends Archivist Meetings: Not on file  . Marital Status: Not on file  Intimate Partner Violence:   . Fear of Current or Ex-Partner: Not on file  . Emotionally Abused: Not on file  . Physically Abused: Not on file  . Sexually Abused: Not on file    No family history on file.   Current Outpatient Medications:  .  buPROPion (WELLBUTRIN SR) 150 MG 12 hr tablet, Take 1 tablet by mouth in the morning and at bedtime., Disp: , Rfl:  .  capsicum (ZOSTRIX) 0.075 % topical cream, Apply 1 application topically every 8 (eight) hours.  (Patient not taking: Reported on 04/22/2020), Disp: , Rfl:  .  lidocaine-prilocaine (EMLA) cream, Apply to affected area once, Disp: 30 g, Rfl: 3 .  lisinopril-hydrochlorothiazide (ZESTORETIC) 20-12.5 MG tablet, Take 1 tablet by mouth daily., Disp: , Rfl:  .  naproxen (NAPROSYN) 500 MG tablet, Take 500 mg by mouth every 12 (twelve) hours as needed.  (Patient not taking: Reported on 05/02/2020), Disp: , Rfl:  .  oxyCODONE (OXY IR/ROXICODONE) 5 MG immediate  release tablet, Take 1 tablet (5 mg total) by mouth every 6 (six) hours as needed for severe pain., Disp: 30 tablet, Rfl: 0 .  prochlorperazine (COMPAZINE) 10 MG tablet, Take 1 tablet (10 mg total) by mouth every 6 (six) hours as needed (Nausea or vomiting)., Disp: 60 tablet, Rfl: 2 No current facility-administered medications for this visit.  Facility-Administered Medications Ordered in Other Visits:  .  0.9 %  sodium chloride infusion, , Intravenous, Continuous, Docia Barrier, PA, Last Rate: 20 mL/hr at 05/02/20 1002, New Bag at 05/02/20 1002 .  ceFAZolin (ANCEF) IVPB 2g/100 mL premix, 2 g, Intravenous, On Call, Zigmund Daniel, Herma [place], PA .  fentaNYL (SUBLIMAZE) 100 MCG/2ML injection, , , ,  .  midazolam (VERSED) 5 MG/5ML injection, , , ,   Physical exam: Physical Exam Constitutional:      General: He is not in acute distress.    Comments: Thin build  Pulmonary:     Effort: No respiratory distress.  Neurological:     Mental Status: He is  alert and oriented to person, place, and time.  Psychiatric:        Mood and Affect: Mood normal.        Behavior: Behavior normal.      CMP Latest Ref Rng & Units 04/04/2020  Glucose 70 - 99 mg/dL 121(H)  BUN 8 - 23 mg/dL 11  Creatinine 0.61 - 1.24 mg/dL 1.09  Sodium 135 - 145 mmol/L 133(L)  Potassium 3.5 - 5.1 mmol/L 4.6  Chloride 98 - 111 mmol/L 98  CO2 22 - 32 mmol/L 27  Calcium 8.9 - 10.3 mg/dL 8.2(L)  Total Protein 6.5 - 8.1 g/dL 7.4  Total Bilirubin 0.3 - 1.2 mg/dL 2.2(H)  Alkaline Phos 38 - 126 U/L 138(H)  AST 15 - 41 U/L 81(H)  ALT 0 - 44 U/L 34   CBC Latest Ref Rng & Units 04/15/2020  WBC 4.0 - 10.5 K/uL 5.8  Hemoglobin 13.0 - 17.0 g/dL 14.0  Hematocrit 39 - 52 % 39.0  Platelets 150 - 400 K/uL 242    No images are attached to the encounter.  NM PET Image Initial (PI) Skull Base To Thigh  Result Date: 04/29/2020 CLINICAL DATA:  Initial treatment strategy for hepatocellular carcinoma. EXAM: NUCLEAR MEDICINE PET  SKULL BASE TO THIGH TECHNIQUE: 8.0 mCi F-18 FDG was injected intravenously. Full-ring PET imaging was performed from the skull base to thigh after the radiotracer. CT data was obtained and used for attenuation correction and anatomic localization. Fasting blood glucose: 88 mg/dl COMPARISON:  Multiple exams, including CT abdomen 04/01/2020 FINDINGS: Mediastinal blood pool activity: SUV max 2.1 Liver activity: SUV max NA NECK: No significant abnormal hypermetabolic activity in this region. Incidental CT findings: Mild chronic left maxillary sinusitis. CHEST: No significant abnormal hypermetabolic activity in this region. Incidental CT findings: Atherosclerotic thoracic aorta. Paraseptal and centrilobular emphysema. ABDOMEN/PELVIS: The multifocal hepatocellular carcinoma scattered throughout the liver has a very similar activity level to the surrounding hepatic parenchyma. In a representative region of primarily normal appearing parenchyma on the prior CT, background hepatic activity has a maximum SUV of about 3.0. In the vicinity of the lobular tumor from the inferior margin of the left hepatic lobe which underwent biopsy, maximum SUV is 3.1. In the infiltrated portion of the lateral segment left hepatic lobe, primarily segment 3, maximum SUV is 3.4. In the vicinity of a tumor in segment 4a of the liver shown on recent CT abdomen, maximum SUV is 2.8. Aside from the contour abnormalities in the liver, the tumor is surprisingly inconspicuous against the background hepatic parenchyma by PET-CT. Incidental CT findings: Hepatic cirrhosis. Mild ascites with omental and mesenteric edema. Malignant ascites is not excluded although the appearance could potentially be from benign third spacing of fluid. Aortoiliac atherosclerotic vascular disease. SKELETON: No significant abnormal hypermetabolic activity in this region. Incidental CT findings: Chronic appearing anterior wedging at L2. Spondylosis and degenerative disc disease  at L5-S1. IMPRESSION: 1. The multifocal hepatocellular carcinoma scattered throughout the hepatic parenchyma has a activity level almost identical to the surrounding hepatic parenchyma, yielding poor conspicuity against the background liver activity. 2. Ascites with mesenteric and omental edema. I cannot completely exclude the possibility of malignant ascites although benign third spacing of fluid related to underlying cirrhosis is a distinct possibility as well. 3. Other imaging findings of potential clinical significance: Chronic left maxillary sinusitis. Aortic Atherosclerosis (ICD10-I70.0). Emphysema (ICD10-J43.9). Hepatic cirrhosis. Electronically Signed   By: Van Clines M.D.   On: 04/29/2020 17:05   US BIOPSY (LIVER)  Result Date:  04/15/2020 INDICATION: Hepatitis-C, cirrhosis and multiple hepatic masses. EXAM: ULTRASOUND GUIDED CORE BIOPSY OF LIVER MASS MEDICATIONS: None. ANESTHESIA/SEDATION: Fentanyl 50 mcg IV; Versed 1.0 mg IV Moderate Sedation Time:  14 minutes. The patient was continuously monitored during the procedure by the interventional radiology nurse under my direct supervision. PROCEDURE: The procedure, risks, benefits, and alternatives were explained to the patient. Questions regarding the procedure were encouraged and answered. The patient understands and consents to the procedure. Ultrasound was performed of the liver. The abdominal wall was prepped with chlorhexidine in a sterile fashion, and a sterile drape was applied covering the operative field. A sterile gown and sterile gloves were used for the procedure. Local anesthesia was provided with 1% Lidocaine. Under ultrasound guidance, a 17 gauge trocar needle was advanced to the level of a hepatic mass. After confirming needle tip position, coaxial 18 gauge core biopsy samples were obtained. Four samples were obtained and submitted in formalin. A slurry of Gel-Foam pledgets was then injected through the outer needle as it was  retracted and removed. COMPLICATIONS: None immediate. FINDINGS: Well-circumscribed rounded exophytic mass is seen off of the inferior aspect of the left lobe measuring approximately 5.2 x 4.8 x 4.3 cm. More ill-defined parenchymal abnormality is also present above the level of the rounded tumor mass within the left lobe. The more discrete mass was targeted for tissue sampling. Solid tissue samples were obtained. Post biopsy imaging demonstrates no hemorrhage around the mass after biopsy. IMPRESSION: Ultrasound-guided core biopsy performed at the level of an exophytic mass off of the left lobe of the liver measuring just over 5 cm in greatest diameter. Electronically Signed   By: Aletta Edouard M.D.   On: 04/15/2020 13:17   IR IMAGING GUIDED PORT INSERTION  Result Date: 05/02/2020 INDICATION: Recent diagnosis of hepatocellular carcinoma. In need of durable intravenous access for chemotherapy administration. EXAM: IMPLANTED PORT A CATH PLACEMENT WITH ULTRASOUND AND FLUOROSCOPIC GUIDANCE COMPARISON:  None. MEDICATIONS: Ancef 3 gm IV; The antibiotic was administered within an appropriate time interval prior to skin puncture. ANESTHESIA/SEDATION: Moderate (conscious) sedation was employed during this procedure. A total of Versed 1 mg and Fentanyl 50 mcg was administered intravenously. Moderate Sedation Time: 23 minutes. The patient's level of consciousness and vital signs were monitored continuously by radiology nursing throughout the procedure under my direct supervision. CONTRAST:  None FLUOROSCOPY TIME:  18 seconds COMPLICATIONS: None immediate. PROCEDURE: The procedure, risks, benefits, and alternatives were explained to the patient. Questions regarding the procedure were encouraged and answered. The patient understands and consents to the procedure. The right neck and chest were prepped with chlorhexidine in a sterile fashion, and a sterile drape was applied covering the operative field. Maximum barrier sterile  technique with sterile gowns and gloves were used for the procedure. A timeout was performed prior to the initiation of the procedure. Local anesthesia was provided with 1% lidocaine with epinephrine. After creating a small venotomy incision, a micropuncture kit was utilized to access the internal jugular vein. Real-time ultrasound guidance was utilized for vascular access including the acquisition of a permanent ultrasound image documenting patency of the accessed vessel. The microwire was utilized to measure appropriate catheter length. A subcutaneous port pocket was then created along the upper chest wall utilizing a combination of sharp and blunt dissection. The pocket was irrigated with sterile saline. A single lumen "standard sized" power injectable port was chosen for placement. The 8 Fr catheter was tunneled from the port pocket site to the venotomy incision. The port  was placed in the pocket. The external catheter was trimmed to appropriate length. At the venotomy, an 8 Fr peel-away sheath was placed over a guidewire under fluoroscopic guidance. The catheter was then placed through the sheath and the sheath was removed. Final catheter positioning was confirmed and documented with a fluoroscopic spot radiograph. The port was accessed with a Huber needle, aspirated and flushed with heparinized saline. The port pocket incision was closed with interrupted 2-0 Vicryl suture. The skin was opposed with a running subcuticular 4-0 Vicryl suture. Dermabond and Steri-strips were applied to both incisions. Dressings were applied. The patient tolerated the procedure well without immediate post procedural complication. FINDINGS: After catheter placement, the tip lies within the superior cavoatrial junction. The catheter aspirates and flushes normally and is ready for immediate use. IMPRESSION: Successful placement of a right internal jugular approach power injectable Port-A-Cath. The catheter is ready for immediate use.  Electronically Signed   By: Sandi Mariscal M.D.   On: 05/02/2020 11:30     Assessment and plan- Patient is a 69 y.o. male who presents to [facility] for initial meeting in preparation for starting chemotherapy for the treatment of [place].    1. Hepatocellular Carcinoma- biopsy confirmed. Treatment given with non-curative/palliative intent. Plan for atezolizumab-bevacizumab. Dr. Grayland Ormond medical oncologist.   2. Chemo Care Clinic/High Risk for ER/Hospitalization during chemotherapy- We discussed the role of the chemo care clinic and identified patient specific risk factors. I discussed that patient was identified as high risk primarily based on age, disease, and previous hospitalizations.   3. Social Determinants of Health- we discussed that social determinants of health may have significant impacts on health and outcomes for cancer patients.  Today we discussed specific social determinants of performance status, alcohol use, depression, financial needs, food insecurity, housing, interpersonal violence, social connections, stress, tobacco use, and transportation. We discussed the wide range of programs available through the cancer center including but not limited to outpatient physical and occupational therapies, CARE program, psychiatry, counseling, support groups, financial assistance and counseling, social work, Building surveyor, smoking cessation, transportation assistance. Patient denies specific needs at this time.   4. Palliative Care- we discussed outpatient palliative care services available at the cancer center which is available to all patient for advanced care planning and ongoing symptom management. Will refer patient to palliative care for ongoing symptom management today.   5. Symptom Management Clinic- discussed the use of the symptom management clinic which is available for acute complaints.   We discussed methods of contacting clinic/provider. Patient denies needing specific  assistance at this time. Patient will be followed by Mariea Clonts, Nurse Navigator.    Visit Diagnosis 1. Hepatocellular carcinoma ([place])     Patient expressed understanding and was in agreement with this plan. He also understands that He can call clinic at any time with any questions, concerns, or complaints.   A total of (10) minutes of face-to-face time was spent with this patient with greater than 50% of that time in counseling and care-coordination.  Beckey Rutter, DNP, AGNP-C [facility]

## 2020-05-02 NOTE — H&P (Signed)
Chief Complaint: [facility]. Request is for portacath for chemotherapy access  Referring Physician(s): Finnegan,Timothy J  Supervising Physician: Sandi Mariscal  Patient Status: [facility] - Out-pt  History of Present Illness: Joshua Ramos is a 69 y.o. male Smoker, History of Hep A, substance abuse. Presented to the ED at [facility] with abdominal pain found to have multiple liver lesions on CT. and elevated AFP and CA - 19 .  IR  performed a left lobe liver biopsy on 9.21.21. Cytology showed [place]. Team is requesting a  portacath placement for chemotherapy access.   Past Medical History:  Diagnosis Date  . Hepatitis A     No past surgical history on file.  Allergies: Patient has no known allergies.  Medications: Prior to Admission medications   Medication Sig Start Date End Date Taking? Authorizing Provider  Acetaminophen-Codeine 300-30 MG tablet Take 1-2 tablets by mouth every 4 (four) hours as needed. 03/19/20 04/18/21  [provider]  buPROPion (WELLBUTRIN SR) 150 MG 12 hr tablet Take 1 tablet by mouth in the morning and at bedtime. 05/15/15   [provider]  capsicum (ZOSTRIX) 0.075 % topical cream Apply 1 application topically every 8 (eight) hours.  Patient not taking: Reported on 04/22/2020 08/03/11   [provider]  lidocaine-prilocaine (EMLA) cream Apply to affected area once 04/23/20   Lloyd Huger, MD  lisinopril-hydrochlorothiazide (ZESTORETIC) 20-12.5 MG tablet Take 1 tablet by mouth daily. 03/19/20 03/19/21  [provider]  naproxen (NAPROSYN) 500 MG tablet Take 500 mg by mouth every 12 (twelve) hours as needed.  07/18/19 07/17/20  [provider]  oxyCODONE (OXY IR/ROXICODONE) 5 MG immediate release tablet Take 1 tablet (5 mg total) by mouth every 6 (six) hours as needed for severe pain. 04/22/20   Lloyd Huger, MD  prochlorperazine (COMPAZINE) 10 MG tablet Take 1 tablet (10 mg total) by mouth every 6 (six) hours as  needed (Nausea or vomiting). 04/23/20   Lloyd Huger, MD     No family history on file.  Social History   Socioeconomic History  . Marital status: Single    Spouse name: Not on file  . Number of children: 0  . Years of education: Not on file  . Highest education level: Not on file  Occupational History  . Occupation: retired  Tobacco Use  . Smoking status: Current Some Day Smoker    Packs/day: 0.50    Types: Cigarettes  . Smokeless tobacco: Never Used  . Tobacco comment: 1 cigarette today  Vaping Use  . Vaping Use: Never used  Substance and Sexual Activity  . Alcohol use: Yes    Comment: occasional drinks  . Drug use: Not on file  . Sexual activity: Not on file  Other Topics Concern  . Not on file  Social History Narrative   Lives by himself   Social Determinants of Health   Financial Resource Strain:   . Difficulty of Paying Living Expenses: Not on file  Food Insecurity:   . Worried About Charity fundraiser in the Last Year: Not on file  . Ran Out of Food in the Last Year: Not on file  Transportation Needs:   . Lack of Transportation (Medical): Not on file  . Lack of Transportation (Non-Medical): Not on file  Physical Activity:   . Days of Exercise per Week: Not on file  . Minutes of Exercise per Session: Not on file  Stress:   . Feeling of Stress : Not on  file  Social Connections:   . Frequency of Communication with Friends and Family: Not on file  . Frequency of Social Gatherings with Friends and Family: Not on file  . Attends Religious Services: Not on file  . Active Member of Clubs or Organizations: Not on file  . Attends Archivist Meetings: Not on file  . Marital Status: Not on file    Review of Systems: A 12 point ROS discussed and pertinent positives are indicated in the HPI above.  All other systems are negative.  Review of Systems  Constitutional: Negative for fever.  HENT: Negative for congestion.   Respiratory: Negative for  cough and shortness of breath.   Cardiovascular: Negative for chest pain.  Gastrointestinal: Negative for abdominal pain.  Neurological: Negative for headaches.  Psychiatric/Behavioral: Negative for behavioral problems and confusion.    Vital Signs: BP (!) 150/82   Pulse 72   Temp 97.6 F (36.4 C) (Oral)   Resp 16   SpO2 100%   Physical Exam Vitals and nursing note reviewed.  Constitutional:      Appearance: He is well-developed.  HENT:     Head: Normocephalic.  Cardiovascular:     Rate and Rhythm: Normal rate and regular rhythm.     Heart sounds: Normal heart sounds.  Pulmonary:     Effort: Pulmonary effort is normal.     Breath sounds: Normal breath sounds.  Musculoskeletal:        General: Normal range of motion.     Cervical back: Normal range of motion.  Skin:    General: Skin is dry.  Neurological:     Mental Status: He is alert and oriented to person, place, and time.     Imaging: NM PET Image Initial (PI) Skull Base To Thigh  Result Date: 04/29/2020 CLINICAL DATA:  Initial treatment strategy for hepatocellular carcinoma. EXAM: NUCLEAR MEDICINE PET SKULL BASE TO THIGH TECHNIQUE: 8.0 mCi F-18 FDG was injected intravenously. Full-ring PET imaging was performed from the skull base to thigh after the radiotracer. CT data was obtained and used for attenuation correction and anatomic localization. Fasting blood glucose: 88 mg/dl COMPARISON:  Multiple exams, including CT abdomen 04/01/2020 FINDINGS: Mediastinal blood pool activity: SUV max 2.1 Liver activity: SUV max NA NECK: No significant abnormal hypermetabolic activity in this region. Incidental CT findings: Mild chronic left maxillary sinusitis. CHEST: No significant abnormal hypermetabolic activity in this region. Incidental CT findings: Atherosclerotic thoracic aorta. Paraseptal and centrilobular emphysema. ABDOMEN/PELVIS: The multifocal hepatocellular carcinoma scattered throughout the liver has a very similar activity  level to the surrounding hepatic parenchyma. In a representative region of primarily normal appearing parenchyma on the prior CT, background hepatic activity has a maximum SUV of about 3.0. In the vicinity of the lobular tumor from the inferior margin of the left hepatic lobe which underwent biopsy, maximum SUV is 3.1. In the infiltrated portion of the lateral segment left hepatic lobe, primarily segment 3, maximum SUV is 3.4. In the vicinity of a tumor in segment 4a of the liver shown on recent CT abdomen, maximum SUV is 2.8. Aside from the contour abnormalities in the liver, the tumor is surprisingly inconspicuous against the background hepatic parenchyma by PET-CT. Incidental CT findings: Hepatic cirrhosis. Mild ascites with omental and mesenteric edema. Malignant ascites is not excluded although the appearance could potentially be from benign third spacing of fluid. Aortoiliac atherosclerotic vascular disease. SKELETON: No significant abnormal hypermetabolic activity in this region. Incidental CT findings: Chronic appearing anterior wedging at  L2. Spondylosis and degenerative disc disease at L5-S1. IMPRESSION: 1. The multifocal hepatocellular carcinoma scattered throughout the hepatic parenchyma has a activity level almost identical to the surrounding hepatic parenchyma, yielding poor conspicuity against the background liver activity. 2. Ascites with mesenteric and omental edema. I cannot completely exclude the possibility of malignant ascites although benign third spacing of fluid related to underlying cirrhosis is a distinct possibility as well. 3. Other imaging findings of potential clinical significance: Chronic left maxillary sinusitis. Aortic Atherosclerosis (ICD10-I70.0). Emphysema (ICD10-J43.9). Hepatic cirrhosis. Electronically Signed   By: Van Clines M.D.   On: 04/29/2020 17:05   US BIOPSY (LIVER)  Result Date: 04/15/2020 INDICATION: Hepatitis-C, cirrhosis and multiple hepatic masses. EXAM:  ULTRASOUND GUIDED CORE BIOPSY OF LIVER MASS MEDICATIONS: None. ANESTHESIA/SEDATION: Fentanyl 50 mcg IV; Versed 1.0 mg IV Moderate Sedation Time:  14 minutes. The patient was continuously monitored during the procedure by the interventional radiology nurse under my direct supervision. PROCEDURE: The procedure, risks, benefits, and alternatives were explained to the patient. Questions regarding the procedure were encouraged and answered. The patient understands and consents to the procedure. Ultrasound was performed of the liver. The abdominal wall was prepped with chlorhexidine in a sterile fashion, and a sterile drape was applied covering the operative field. A sterile gown and sterile gloves were used for the procedure. Local anesthesia was provided with 1% Lidocaine. Under ultrasound guidance, a 17 gauge trocar needle was advanced to the level of a hepatic mass. After confirming needle tip position, coaxial 18 gauge core biopsy samples were obtained. Four samples were obtained and submitted in formalin. A slurry of Gel-Foam pledgets was then injected through the outer needle as it was retracted and removed. COMPLICATIONS: None immediate. FINDINGS: Well-circumscribed rounded exophytic mass is seen off of the inferior aspect of the left lobe measuring approximately 5.2 x 4.8 x 4.3 cm. More ill-defined parenchymal abnormality is also present above the level of the rounded tumor mass within the left lobe. The more discrete mass was targeted for tissue sampling. Solid tissue samples were obtained. Post biopsy imaging demonstrates no hemorrhage around the mass after biopsy. IMPRESSION: Ultrasound-guided core biopsy performed at the level of an exophytic mass off of the left lobe of the liver measuring just over 5 cm in greatest diameter. Electronically Signed   By: Aletta Edouard M.D.   On: 04/15/2020 13:17    Labs:  CBC: Recent Labs    04/01/20 1023 04/04/20 1007 04/15/20 1009  WBC 7.3 7.1 5.8  HGB 13.3  13.5 14.0  HCT 37.5* 37.6* 39.0  PLT 131* 164 242    COAGS: Recent Labs    04/01/20 1324 04/15/20 1009  INR 1.4* 1.2    BMP: Recent Labs    04/01/20 1023 04/04/20 1007  NA 133* 133*  K 4.3 4.6  CL 101 98  CO2 28 27  GLUCOSE 90 121*  BUN 11 11  CALCIUM 8.2* 8.2*  CREATININE 1.02 1.09  GFRNONAA >60 >60  GFRAA >60 >60    LIVER FUNCTION TESTS: Recent Labs    04/01/20 1023 04/04/20 1007  BILITOT 2.5* 2.2*  AST 102* 81*  ALT 41 34  ALKPHOS 129* 138*  PROT 7.3 7.4  ALBUMIN 2.3* 2.2*    Assessment and Plan: 69 y.o. male outpatient. Smoker, History of Hep A, substance abuse. Presented to the ED at [facility] with abdominal pain found to have multiple liver lesions on CT. and elevated AFP and CA - 19 .  IR  performed a left lobe  liver biopsy on 9.21.21. Cytology showed [place]. Team is requesting a  portacath placement for chemotherapy access.    PET from 10.5.21 shows RIJ is accessible. Labs from 9.21.21 and medications are within acceptable parameters. NKDA. Patient has been NPO since midnight.    Risks and benefits of image guided port-a-catheter placement was discussed with the patient including, but not limited to bleeding, infection, pneumothorax, or fibrin sheath development and need for additional procedures.  All of the patient's questions were answered, patient is agreeable to proceed. Consent signed and in chart.  Thank you for this interesting consult.  I greatly enjoyed meeting Joshua Ramos and look forward to participating in their care.  A copy of this report was sent to the requesting provider on this date.  Electronically Signed: Jacqualine Mau, NP 05/02/2020, 9:52 AM   I spent a total of  40 Minutes   in face to face in clinical consultation, greater than 50% of which was counseling/coordinating care for portacath placement.

## 2020-05-02 NOTE — Progress Notes (Signed)
Please stop tylenol 3. Will refill oxycodone 5 mg tablets. He can follow up with Dr. Grayland Ormond as scheduled next week.

## 2020-05-02 NOTE — Telephone Encounter (Signed)
Refill of oxycodone 5 mg tablets sent to pharmacy. Please stop tylenol 3. Follow up with Dr. Grayland Ormond as scheduled.

## 2020-05-02 NOTE — Procedures (Signed)
Pre Procedure Dx: Poor venous access Post Procedural Dx: Same  Successful placement of right IJ approach port-a-cath with tip at the superior caval atrial junction. The catheter is ready for immediate use.  Estimated Blood Loss: Minimal  Complications: None immediate.  Jay Phylis Javed, MD Pager #: [phone]   

## 2020-05-02 NOTE — Telephone Encounter (Signed)
Left message on patient voice mail that Oxycodone was refilled and to stop the Tylenol with Codiene

## 2020-05-02 NOTE — Discharge Instructions (Signed)
Implanted Port Insertion, Care After °This sheet gives you information about how to care for yourself after your procedure. Your health care provider may also give you more specific instructions. If you have problems or questions, contact your health care provider. °What can I expect after the procedure? °After the procedure, it is common to have: °· Discomfort at the port insertion site. °· Bruising on the skin over the port. This should improve over 3-4 days. °Follow these instructions at home: °Port care °· After your port is placed, you will get a manufacturer's information card. The card has information about your port. Keep this card with you at all times. °· Take care of the port as told by your health care provider. Ask your health care provider if you or a family member can get training for taking care of the port at home. A home health care nurse may also take care of the port. °· Make sure to remember what type of port you have. °Incision care ° °  ° °· Follow instructions from your health care provider about how to take care of your port insertion site. Make sure you: °? Wash your hands with soap and water before and after you change your bandage (dressing). If soap and water are not available, use hand sanitizer. °? Change your dressing as told by your health care provider. °? Leave stitches (sutures), skin glue, or adhesive strips in place. These skin closures may need to stay in place for 2 weeks or longer. If adhesive strip edges start to loosen and curl up, you may trim the loose edges. Do not remove adhesive strips completely unless your health care provider tells you to do that. °· Check your port insertion site every day for signs of infection. Check for: °? Redness, swelling, or pain. °? Fluid or blood. °? Warmth. °? Pus or a bad smell. °Activity °· Return to your normal activities as told by your health care provider. Ask your health care provider what activities are safe for you. °· Do not  lift anything that is heavier than 10 lb (4.5 kg), or the limit that you are told, until your health care provider says that it is safe. °General instructions °· Take over-the-counter and prescription medicines only as told by your health care provider. °· Do not take baths, swim, or use a hot tub until your health care provider approves. Ask your health care provider if you may take showers. You may only be allowed to take sponge baths. °· Do not drive for 24 hours if you were given a sedative during your procedure. °· Wear a medical alert bracelet in case of an emergency. This will tell any health care providers that you have a port. °· Keep all follow-up visits as told by your health care provider. This is important. °Contact a health care provider if: °· You cannot flush your port with saline as directed, or you cannot draw blood from the port. °· You have a fever or chills. °· You have redness, swelling, or pain around your port insertion site. °· You have fluid or blood coming from your port insertion site. °· Your port insertion site feels warm to the touch. °· You have pus or a bad smell coming from the port insertion site. °Get help right away if: °· You have chest pain or shortness of breath. °· You have bleeding from your port that you cannot control. °Summary °· Take care of the port as told by your health   care provider. Keep the manufacturer's information card with you at all times. °· Change your dressing as told by your health care provider. °· Contact a health care provider if you have a fever or chills or if you have redness, swelling, or pain around your port insertion site. °· Keep all follow-up visits as told by your health care provider. °This information is not intended to replace advice given to you by your health care provider. Make sure you discuss any questions you have with your health care provider. °Document Revised: 02/07/2018 Document Reviewed: 02/07/2018 °Elsevier Patient Education ©  2020 [employer] ° °

## 2020-05-02 NOTE — Sedation Documentation (Signed)
Total sedation time: 23 min.

## 2020-05-06 NOTE — Progress Notes (Signed)
Patient states that his right side feels like it is asleep. This has been going on for about a week.

## 2020-05-07 ENCOUNTER — Inpatient Hospital Stay: Payer: Medicare HMO

## 2020-05-07 ENCOUNTER — Inpatient Hospital Stay (HOSPITAL_BASED_OUTPATIENT_CLINIC_OR_DEPARTMENT_OTHER): Payer: Medicare HMO | Admitting: Oncology

## 2020-05-07 ENCOUNTER — Other Ambulatory Visit: Payer: Self-pay

## 2020-05-07 ENCOUNTER — Encounter: Payer: Self-pay | Admitting: Oncology

## 2020-05-07 VITALS — BP 135/77 | HR 76 | Temp 97.3°F | Resp 18 | Wt 154.0 lb

## 2020-05-07 DIAGNOSIS — Z95828 Presence of other vascular implants and grafts: Secondary | ICD-10-CM

## 2020-05-07 DIAGNOSIS — Z5111 Encounter for antineoplastic chemotherapy: Secondary | ICD-10-CM | POA: Diagnosis not present

## 2020-05-07 DIAGNOSIS — C220 Liver cell carcinoma: Secondary | ICD-10-CM | POA: Diagnosis not present

## 2020-05-07 LAB — URINALYSIS, DIPSTICK ONLY
Bilirubin Urine: NEGATIVE
Glucose, UA: NEGATIVE mg/dL
Hgb urine dipstick: NEGATIVE
Ketones, ur: NEGATIVE mg/dL
Leukocytes,Ua: NEGATIVE
Nitrite: NEGATIVE
Protein, ur: NEGATIVE mg/dL
Specific Gravity, Urine: 1.019 (ref 1.005–1.030)
pH: 7 (ref 5.0–8.0)

## 2020-05-07 LAB — COMPREHENSIVE METABOLIC PANEL
ALT: 31 U/L (ref 0–44)
AST: 79 U/L — ABNORMAL HIGH (ref 15–41)
Albumin: 2.1 g/dL — ABNORMAL LOW (ref 3.5–5.0)
Alkaline Phosphatase: 123 U/L (ref 38–126)
Anion gap: 6 (ref 5–15)
BUN: 13 mg/dL (ref 8–23)
CO2: 26 mmol/L (ref 22–32)
Calcium: 8.2 mg/dL — ABNORMAL LOW (ref 8.9–10.3)
Chloride: 104 mmol/L (ref 98–111)
Creatinine, Ser: 1.08 mg/dL (ref 0.61–1.24)
GFR, Estimated: >60 mL/min (ref >60)
Glucose, Bld: 99 mg/dL (ref 70–99)
Potassium: 4.1 mmol/L (ref 3.5–5.1)
Sodium: 136 mmol/L (ref 135–145)
Total Bilirubin: 1.7 mg/dL — ABNORMAL HIGH (ref 0.3–1.2)
Total Protein: 7.2 g/dL (ref 6.5–8.1)

## 2020-05-07 LAB — CBC WITH DIFFERENTIAL/PLATELET
Abs Immature Granulocytes: 0.04 10*3/uL (ref 0.00–0.07)
Basophils Absolute: 0 10*3/uL (ref 0.0–0.1)
Basophils Relative: 0 %
Eosinophils Absolute: 0.2 10*3/uL (ref 0.0–0.5)
Eosinophils Relative: 2 %
HCT: 37.4 % — ABNORMAL LOW (ref 39.0–52.0)
Hemoglobin: 13.4 g/dL (ref 13.0–17.0)
Immature Granulocytes: 1 %
Lymphocytes Relative: 20 %
Lymphs Abs: 1.6 10*3/uL (ref 0.7–4.0)
MCH: 34 pg (ref 26.0–34.0)
MCHC: 35.8 g/dL (ref 30.0–36.0)
MCV: 94.9 fL (ref 80.0–100.0)
Monocytes Absolute: 0.9 10*3/uL (ref 0.1–1.0)
Monocytes Relative: 12 %
Neutro Abs: 5.1 10*3/uL (ref 1.7–7.7)
Neutrophils Relative %: 65 %
Platelets: 177 10*3/uL (ref 150–400)
RBC: 3.94 MIL/uL — ABNORMAL LOW (ref 4.22–5.81)
RDW: 15.5 % (ref 11.5–15.5)
WBC: 7.9 10*3/uL (ref 4.0–10.5)
nRBC: 0 % (ref 0.0–0.2)

## 2020-05-07 LAB — TSH: TSH: 3.288 u[IU]/mL (ref 0.350–4.500)

## 2020-05-07 MED ORDER — HEPARIN SOD (PORK) LOCK FLUSH 100 UNIT/ML IV SOLN
500.0000 [IU] | Freq: Once | INTRAVENOUS | Status: AC
  Administered 2020-05-07: 500 [IU] via INTRAVENOUS
  Filled 2020-05-07: qty 5

## 2020-05-07 MED ORDER — SODIUM CHLORIDE 0.9% FLUSH
10.0000 mL | Freq: Once | INTRAVENOUS | Status: AC
  Administered 2020-05-07: 10 mL via INTRAVENOUS
  Filled 2020-05-07: qty 10

## 2020-05-07 MED ORDER — SODIUM CHLORIDE 0.9 % IV SOLN
Freq: Once | INTRAVENOUS | Status: AC
  Filled 2020-05-07: qty 250

## 2020-05-07 MED ORDER — HEPARIN SOD (PORK) LOCK FLUSH 100 UNIT/ML IV SOLN
INTRAVENOUS | Status: AC
  Filled 2020-05-07: qty 5

## 2020-05-07 MED ORDER — SODIUM CHLORIDE 0.9 % IV SOLN
1200.0000 mg | Freq: Once | INTRAVENOUS | Status: AC
  Administered 2020-05-07: 1200 mg via INTRAVENOUS
  Filled 2020-05-07: qty 20

## 2020-05-08 ENCOUNTER — Telehealth: Payer: Self-pay

## 2020-05-08 ENCOUNTER — Other Ambulatory Visit: Payer: Self-pay | Admitting: Oncology

## 2020-05-08 DIAGNOSIS — C220 Liver cell carcinoma: Secondary | ICD-10-CM

## 2020-05-08 LAB — T4: T4, Total: 8.6 ug/dL (ref 4.5–12.0)

## 2020-05-08 NOTE — Telephone Encounter (Signed)
Telephone call to patient for follow up after receiving first infusion.   Patient states infusion went great.  States eating good and drinking plenty of fluids.   Denies any nausea or vomiting.  Encouraged patient to call for any concerns or questions. 

## 2020-05-15 ENCOUNTER — Other Ambulatory Visit: Payer: Self-pay | Admitting: *Deleted

## 2020-05-15 MED ORDER — OXYCODONE HCL 5 MG PO TABS
5.0000 mg | ORAL_TABLET | Freq: Four times a day (QID) | ORAL | 0 refills | Status: DC | PRN
Start: 2020-05-15 — End: 2020-06-06

## 2020-05-26 ENCOUNTER — Emergency Department: Payer: Medicare HMO

## 2020-05-26 ENCOUNTER — Encounter: Payer: Self-pay | Admitting: Intensive Care

## 2020-05-26 ENCOUNTER — Other Ambulatory Visit: Payer: Self-pay

## 2020-05-26 ENCOUNTER — Emergency Department
Admission: EM | Admit: 2020-05-26 | Discharge: 2020-05-27 | Disposition: A | Discharge status: Home / Self Care | Payer: Medicare HMO | Attending: Emergency Medicine | Admitting: Emergency Medicine

## 2020-05-26 DIAGNOSIS — Z79899 Other long term (current) drug therapy: Secondary | ICD-10-CM | POA: Insufficient documentation

## 2020-05-26 DIAGNOSIS — R531 Weakness: Secondary | ICD-10-CM

## 2020-05-26 DIAGNOSIS — Z20822 Contact with and (suspected) exposure to covid-19: Secondary | ICD-10-CM | POA: Diagnosis not present

## 2020-05-26 DIAGNOSIS — H538 Other visual disturbances: Secondary | ICD-10-CM

## 2020-05-26 DIAGNOSIS — G44209 Tension-type headache, unspecified, not intractable: Secondary | ICD-10-CM | POA: Diagnosis not present

## 2020-05-26 DIAGNOSIS — Z87891 Personal history of nicotine dependence: Secondary | ICD-10-CM | POA: Insufficient documentation

## 2020-05-26 DIAGNOSIS — R519 Headache, unspecified: Secondary | ICD-10-CM | POA: Diagnosis present

## 2020-05-26 DIAGNOSIS — C228 Malignant neoplasm of liver, primary, unspecified as to type: Secondary | ICD-10-CM | POA: Insufficient documentation

## 2020-05-26 DIAGNOSIS — R700 Elevated erythrocyte sedimentation rate: Secondary | ICD-10-CM

## 2020-05-26 DIAGNOSIS — I10 Essential (primary) hypertension: Secondary | ICD-10-CM | POA: Diagnosis not present

## 2020-05-26 DIAGNOSIS — N179 Acute kidney failure, unspecified: Secondary | ICD-10-CM

## 2020-05-26 LAB — CBC
HCT: 34.8 % — ABNORMAL LOW (ref 39.0–52.0)
Hemoglobin: 12.5 g/dL — ABNORMAL LOW (ref 13.0–17.0)
MCH: 34.8 pg — ABNORMAL HIGH (ref 26.0–34.0)
MCHC: 35.9 g/dL (ref 30.0–36.0)
MCV: 96.9 fL (ref 80.0–100.0)
Platelets: 148 10*3/uL — ABNORMAL LOW (ref 150–400)
RBC: 3.59 MIL/uL — ABNORMAL LOW (ref 4.22–5.81)
RDW: 14.7 % (ref 11.5–15.5)
WBC: 7.6 10*3/uL (ref 4.0–10.5)
nRBC: 0 % (ref 0.0–0.2)

## 2020-05-26 LAB — BASIC METABOLIC PANEL
Anion gap: 6 (ref 5–15)
BUN: 27 mg/dL — ABNORMAL HIGH (ref 8–23)
CO2: 25 mmol/L (ref 22–32)
Calcium: 8.4 mg/dL — ABNORMAL LOW (ref 8.9–10.3)
Chloride: 101 mmol/L (ref 98–111)
Creatinine, Ser: 1.41 mg/dL — ABNORMAL HIGH (ref 0.61–1.24)
GFR, Estimated: 54 mL/min — ABNORMAL LOW (ref >60)
Glucose, Bld: 116 mg/dL — ABNORMAL HIGH (ref 70–99)
Potassium: 4 mmol/L (ref 3.5–5.1)
Sodium: 132 mmol/L — ABNORMAL LOW (ref 135–145)

## 2020-05-26 LAB — URINALYSIS, COMPLETE (UACMP) WITH MICROSCOPIC
Bacteria, UA: NONE SEEN
Glucose, UA: NEGATIVE mg/dL
Hgb urine dipstick: NEGATIVE
Ketones, ur: NEGATIVE mg/dL
Leukocytes,Ua: NEGATIVE
Nitrite: NEGATIVE
Protein, ur: 30 mg/dL — AB
Specific Gravity, Urine: 1.025 (ref 1.005–1.030)
pH: 6 (ref 5.0–8.0)

## 2020-05-26 LAB — HEPATIC FUNCTION PANEL
ALT: 56 U/L — ABNORMAL HIGH (ref 0–44)
AST: 139 U/L — ABNORMAL HIGH (ref 15–41)
Albumin: 1.9 g/dL — ABNORMAL LOW (ref 3.5–5.0)
Alkaline Phosphatase: 181 U/L — ABNORMAL HIGH (ref 38–126)
Bilirubin, Direct: 1.7 mg/dL — ABNORMAL HIGH (ref 0.0–0.2)
Indirect Bilirubin: 1.4 mg/dL — ABNORMAL HIGH (ref 0.3–0.9)
Total Bilirubin: 3.1 mg/dL — ABNORMAL HIGH (ref 0.3–1.2)
Total Protein: 7.3 g/dL (ref 6.5–8.1)

## 2020-05-26 LAB — SEDIMENTATION RATE: Sed Rate: 90 mm/hr — ABNORMAL HIGH (ref 0–20)

## 2020-05-26 IMAGING — CT CT ANGIO NECK
1 of 10 series · 6 of 35 positions shown · IV contrast (APPLIED)
Comparison: None.

CLINICAL DATA: Dizziness.  Headache.

EXAM:
CT ANGIOGRAPHY HEAD AND NECK
TECHNIQUE: Multidetector CT imaging of the head and neck was performed using
the standard protocol during bolus administration of intravenous
contrast. Multiplanar CT image reconstructions and MIPs were
obtained to evaluate the vascular anatomy. Carotid stenosis
measurements (when applicable) are obtained utilizing NASCET
criteria, using the distal internal carotid diameter as the
denominator.
CONTRAST:  75mL OMNIPAQUE IOHEXOL 350 MG/ML SOLN

[Series 10: ax thin · axial · 0.37mm/px · z∈[-134,+128]mm · 6 of 376 slices shown]
[im 54/376  soft-tissue]
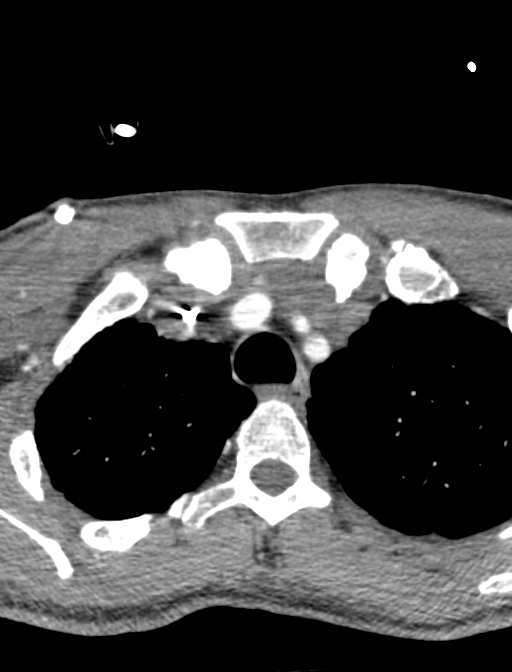
[im 108/376  bone]
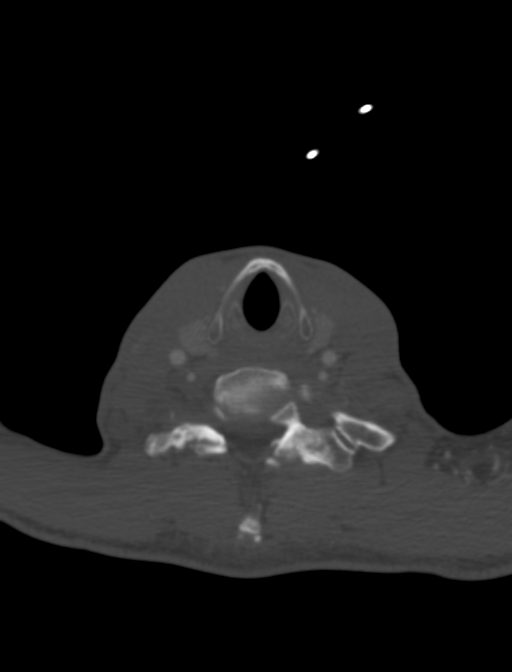
[im 161/376  soft-tissue]
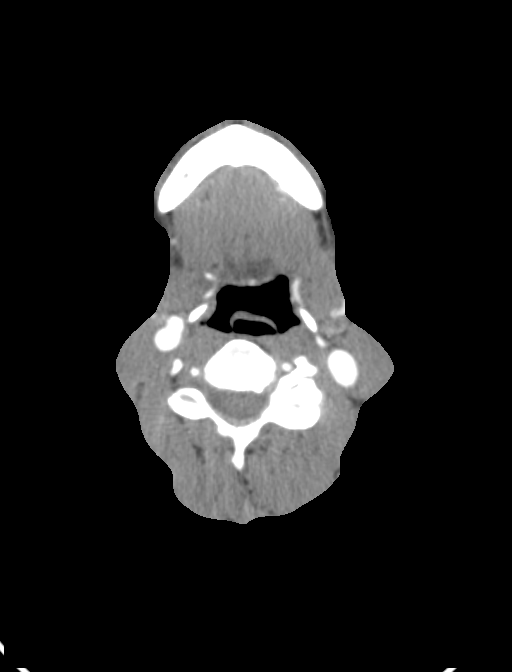
[im 215/376  bone]
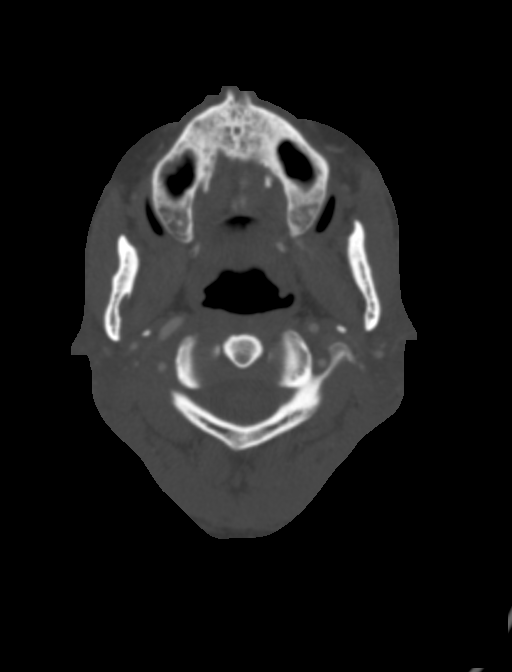
[im 268/376  soft-tissue]
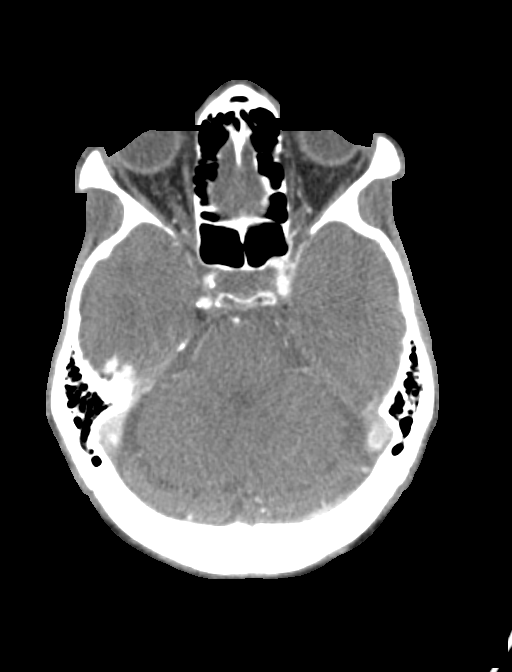
[im 322/376  bone]
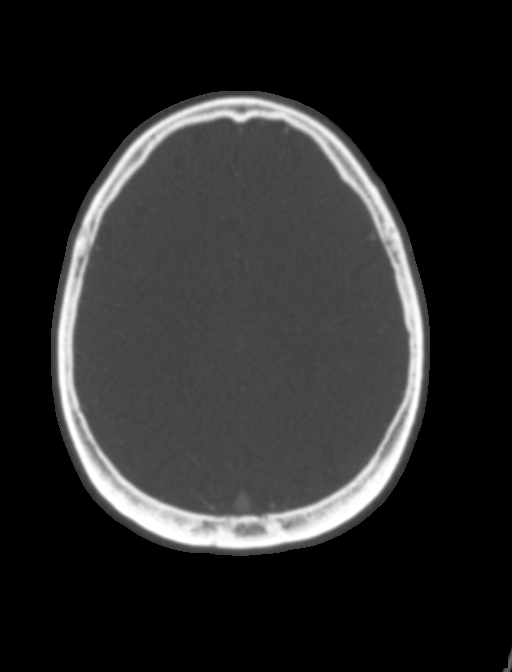

[6 of 35 positions shown; findings below may reference images not displayed]

FINDINGS: CT HEAD FINDINGS

Brain: There is no evidence of an acute infarct, intracranial
hemorrhage, mass, midline shift, or extra-axial fluid collection.
The ventricles and sulci are normal. Scattered hypodensities in the
cerebral white matter bilaterally are nonspecific but compatible
with mild chronic small vessel ischemic disease.

Vascular: Calcified atherosclerosis at the skull base. No hyperdense
vessel.

Skull: No fracture or suspicious osseous lesion.

Sinuses: Focal mucosal thickening superiorly in the left maxillary
sinus. Clear mastoid air cells.

Orbits: Unremarkable.

Review of the MIP images confirms the above findings

CTA NECK FINDINGS

Aortic arch: Standard 3 vessel aortic arch with mild atherosclerotic
plaque. Widely patent arch vessel origins.

Right carotid system: Patent without evidence of stenosis,
dissection, or significant atherosclerosis.

Left carotid system: Patent without evidence of stenosis,
dissection, or significant atherosclerosis.

Vertebral arteries: Patent and codominant without evidence of
stenosis, dissection, or significant atherosclerosis.

Skeleton: Edentulous. Advanced multilevel cervical facet arthrosis.
Moderate disc degeneration at C6-7.

Other neck: No definite evidence of cervical lymphadenopathy or mass
although assessment is limited by paucity of fat and contrast timing
which was not tailored for evaluation of the soft tissues.

Upper chest: Moderate centrilobular and paraseptal emphysema.
Partially visualized right jugular Port-A-Cath.

Review of the MIP images confirms the above findings

CTA HEAD FINDINGS

Anterior circulation: The internal carotid arteries are patent from
skull base to carotid termini with atherosclerosis resulting in mild
right greater than left cavernous segment stenoses. ACAs and MCAs
are patent without evidence of a significant A1 or M1 stenosis or
proximal branch occlusion. Diffusely irregular appearance of the
branch vessels on reformats may reflect a combination of artifact
from image noise and atherosclerosis. No aneurysm is identified.

Posterior circulation: The intracranial vertebral arteries are
widely patent to the basilar. Patent left PICA, right AICA, and
bilateral SCA origins are visualized. The basilar artery is widely
patent. There is a small left posterior communicating artery. Both
PCAs are patent without evidence of a significant proximal stenosis
on the right. There is a moderate proximal left P2 stenosis. No
aneurysm is identified.

Venous sinuses: Patent.

Anatomic variants: None.

Review of the MIP images confirms the above findings
IMPRESSION: 1. No evidence of acute intracranial abnormality.
2. Mild chronic small vessel ischemic disease.
3. Intracranial atherosclerosis with mild bilateral ICA and moderate
left P2 stenoses.
4. Widely patent cervical carotid and vertebral arteries.
5. Aortic Atherosclerosis ([RI]-[RI]) and Emphysema ([RI]-[RI]).

## 2020-05-26 IMAGING — CR DG CHEST 2V
2 series · 2 of 2 positions shown · non-contrast
Comparison: Included lung apices from neck CTA earlier today. PET
CT [DATE]

CLINICAL DATA: Dizziness.  History of hepatocellular carcinoma.

EXAM:
CHEST - 2 VIEW

[chest pa]
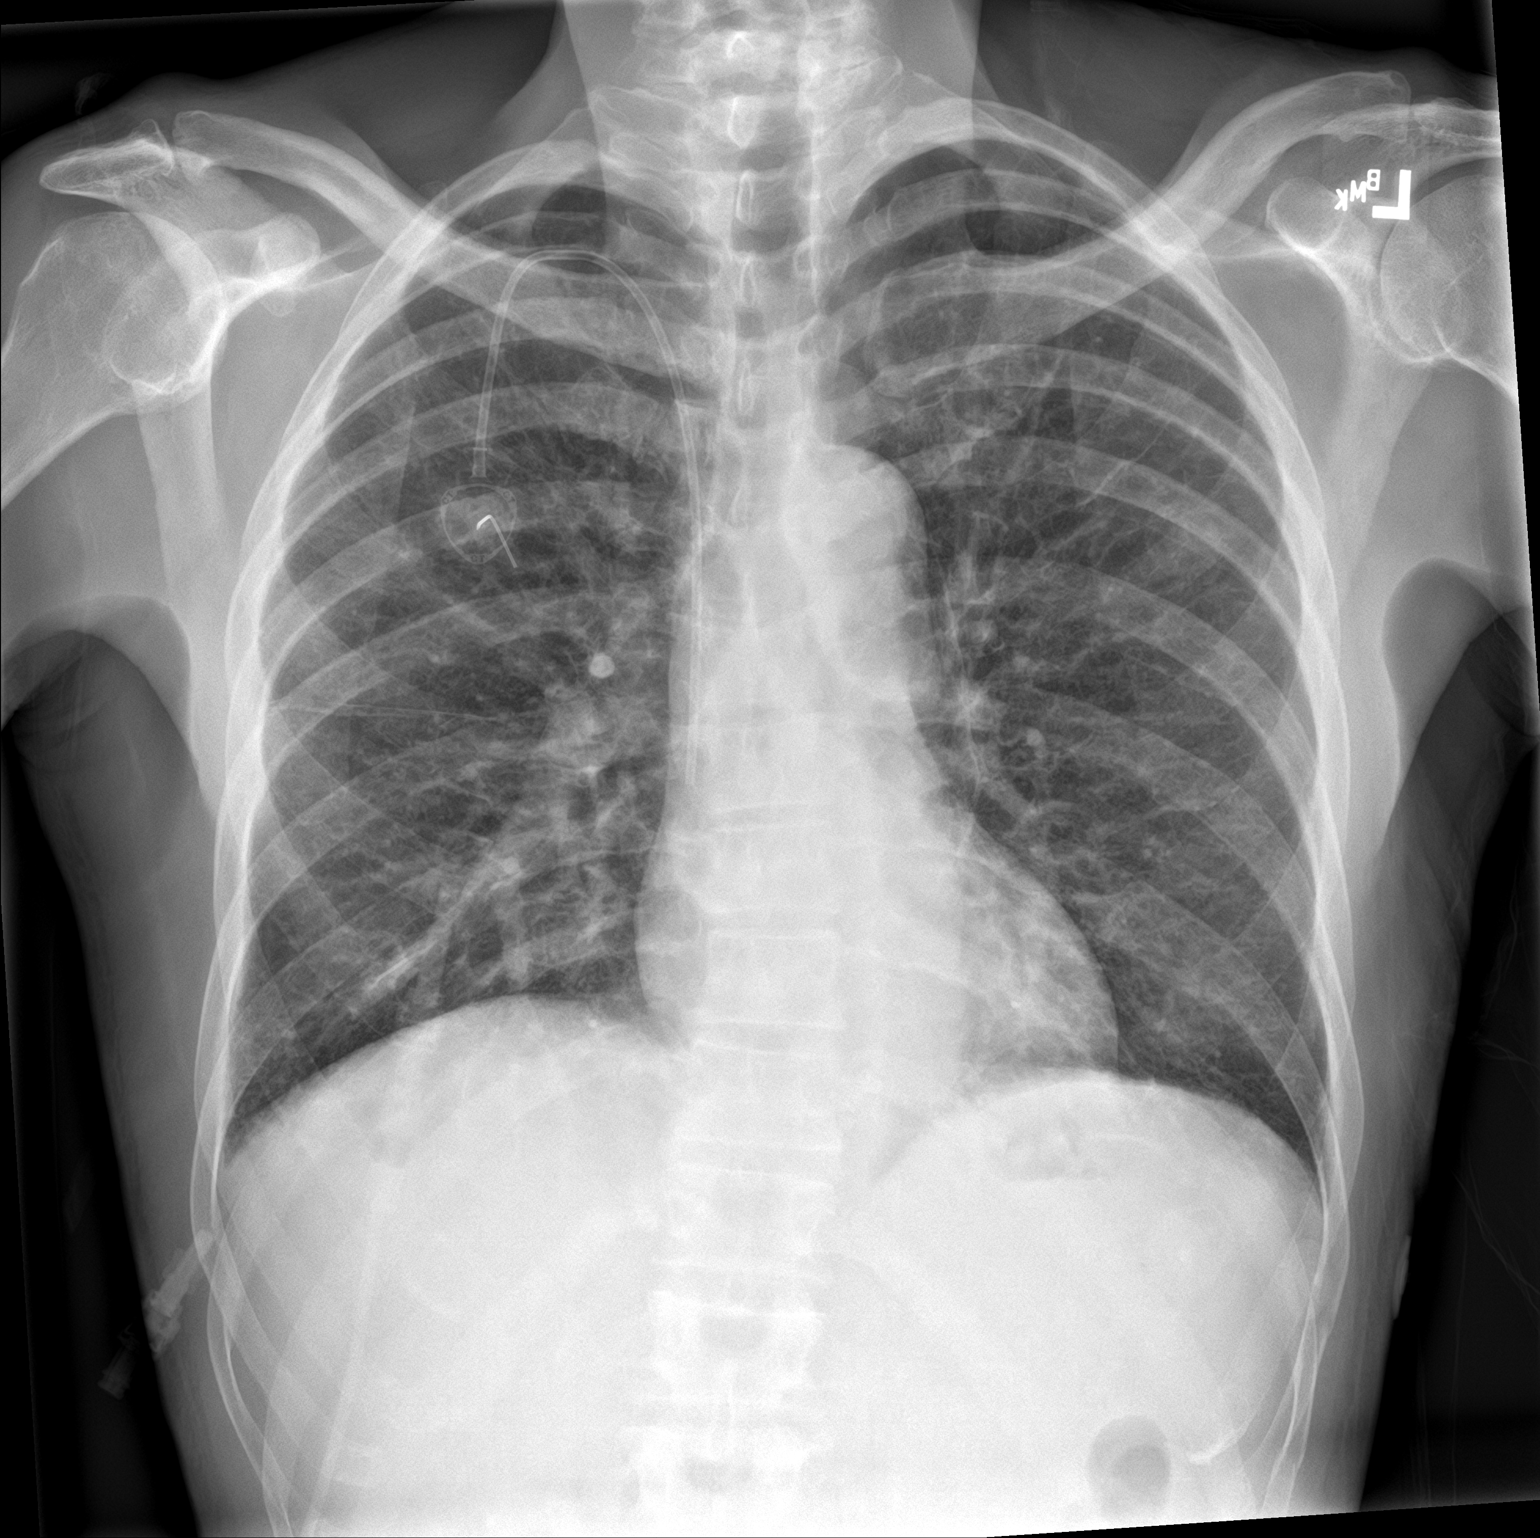

[chest lat]
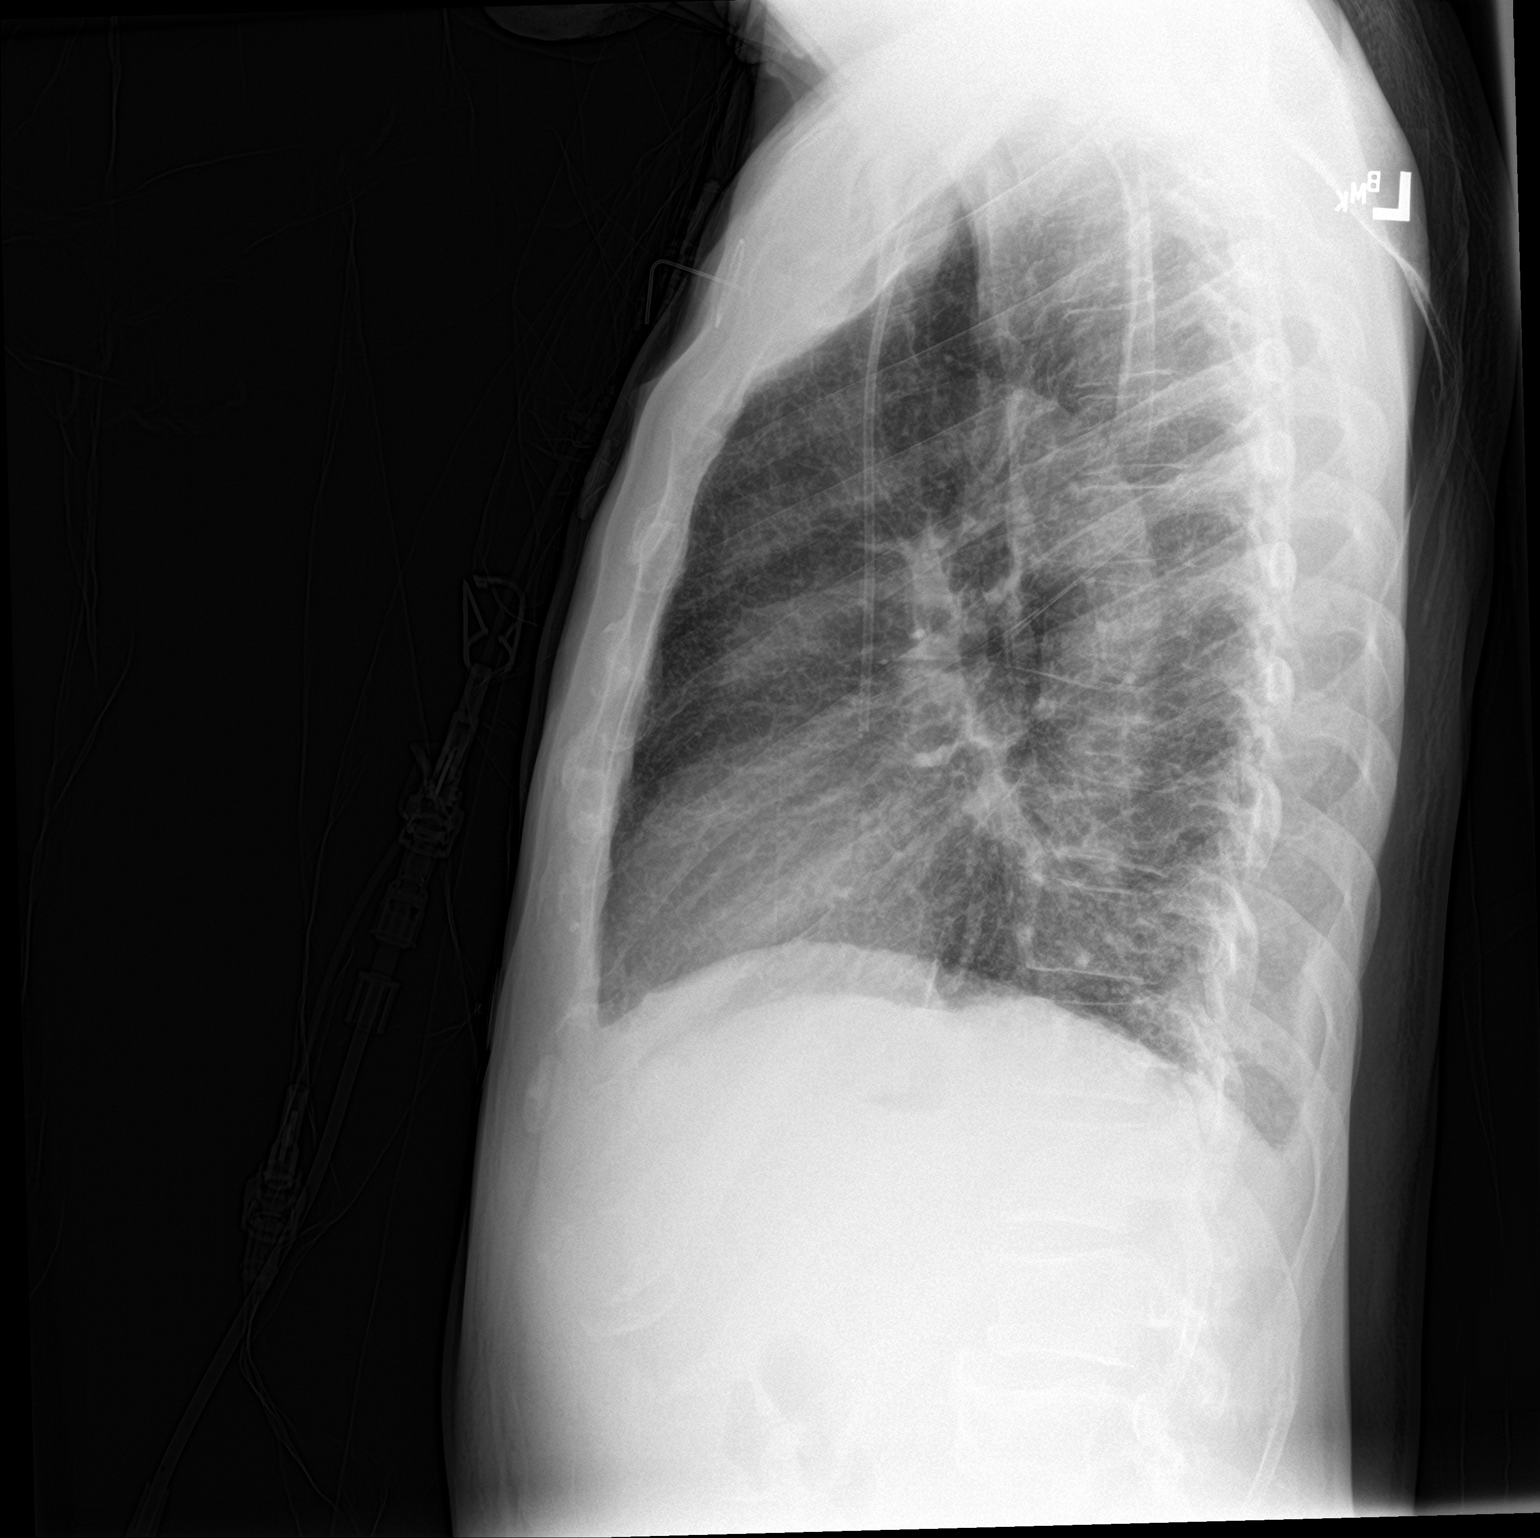

[2 of 2 positions shown; findings below may reference images not displayed]

FINDINGS: Right chest port in place. Emphysema and bronchial thickening. Heart
is normal in size with normal mediastinal contours. Aortic
atherosclerosis. No focal airspace disease, pleural effusion, or
pneumothorax. No acute osseous abnormalities are seen.
IMPRESSION: Emphysema and bronchial thickening.  No superimposed acute findings.

## 2020-05-26 IMAGING — MR MR HEAD W/O CM
14 series · 47 of 48 positions shown · non-contrast
Comparison: Prior CT from earlier the same day.

CLINICAL DATA: Initial evaluation for neuro deficit, dizziness with
severe headache. History of hepatocellular carcinoma. Six

EXAM:
MRI HEAD WITHOUT CONTRAST
TECHNIQUE: Multiplanar, multiecho pulse sequences of the brain and surrounding
structures were obtained without intravenous contrast.

[Series 5: ax dwi_tracew · axial · 3.0mm · 0.60mm/px · z∈[-118,+33]mm · 2 of 48 slices shown]
[im 1/48]
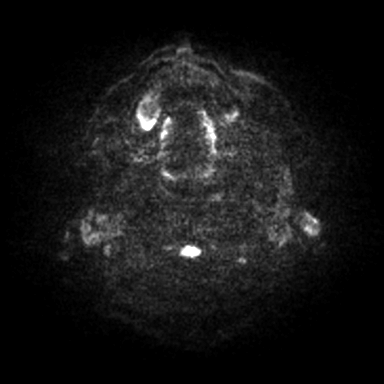
[im 48/48]
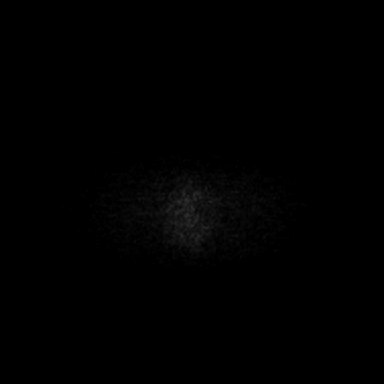

[Series 6: ax dwi_adc · axial · 3.0mm · 0.60mm/px · z∈[-118,+24]mm · 2 of 44 slices shown]
[im 1/44]
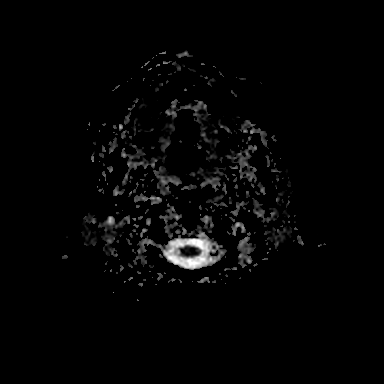
[im 44/44]
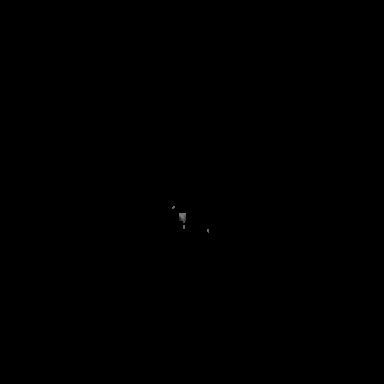

[Series 7: cor dwi_tracew · coronal · 5.0mm · 0.60mm/px · 3 of 40 slices shown (1 of 2)]
[im 1/40]
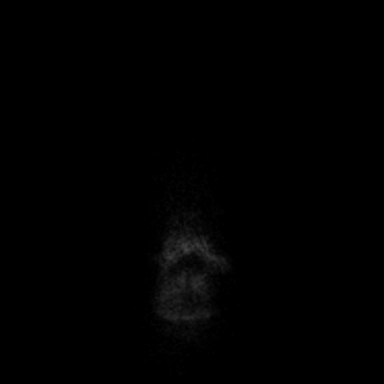
[im 20/40]
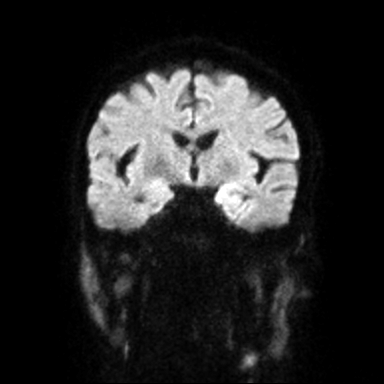
[im 40/40]
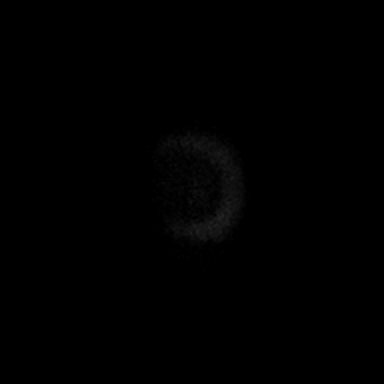

[Series 8: cor dwi_adc · coronal · 5.0mm · 0.60mm/px · 3 of 38 slices shown (1 of 2)]
[im 1/38]
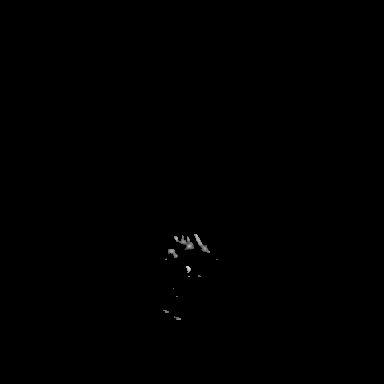
[im 19/38]
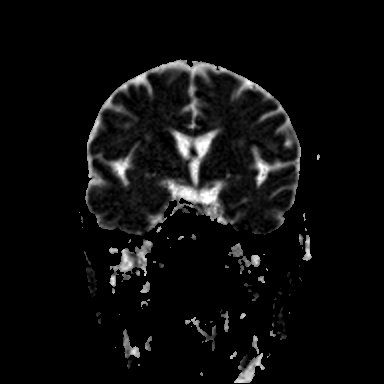
[im 38/38]
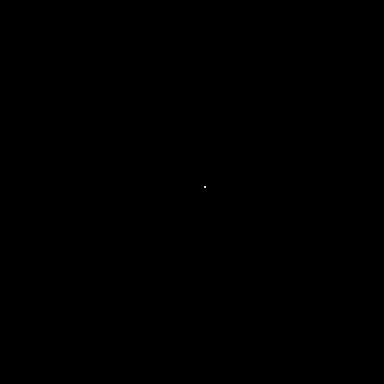

[Series 9: T1 · sagittal · 5.0mm · 0.62mm/px · 2 of 25 slices shown (1 of 3)]
[im 1/25]
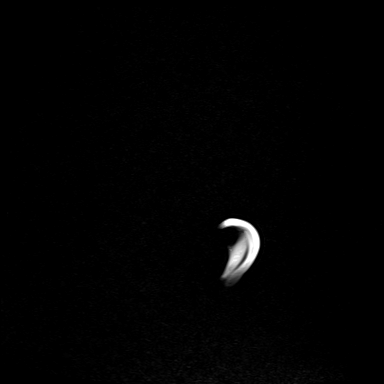
[im 25/25]
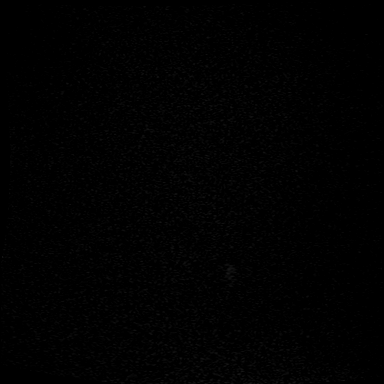

[Series 10: T2 · axial · 5.0mm · 0.53mm/px · z∈[-114,+32]mm · 2 of 26 slices shown (1 of 2)]
[im 1/26]
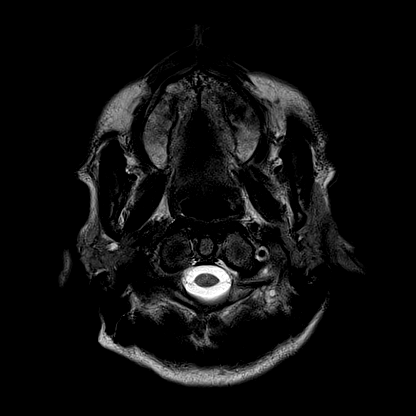
[im 26/26]
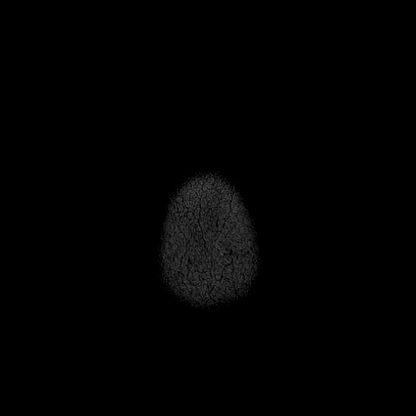

[Series 12: pha_images · axial · 3.0mm · 0.90mm/px · z∈[-129,+44]mm · 4 of 58 slices shown]
[im 1/58]
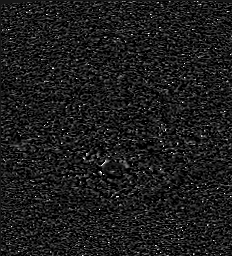
[im 20/58]
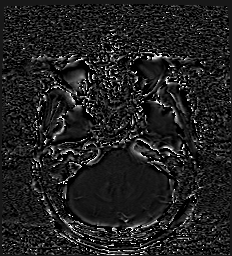
[im 39/58]
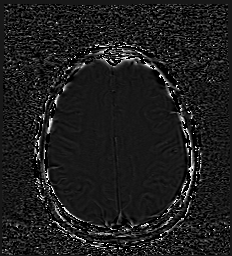
[im 58/58]
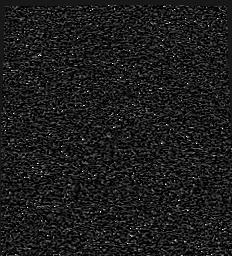

[Series 13: swi_images · axial · 3.0mm · 0.90mm/px · z∈[-129,+44]mm · 4 of 60 slices shown]
[im 1/60]
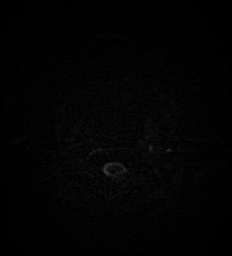
[im 20/60]
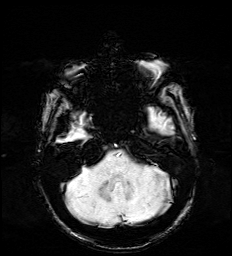
[im 40/60]
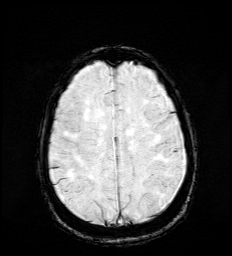
[im 60/60]
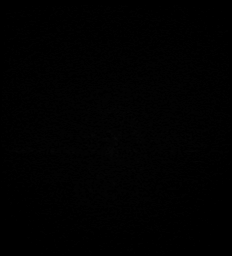

[Series 15: FLAIR · axial · 3.0mm · 0.53mm/px · z∈[-120,+38]mm · 4 of 55 slices shown]
[im 1/55]
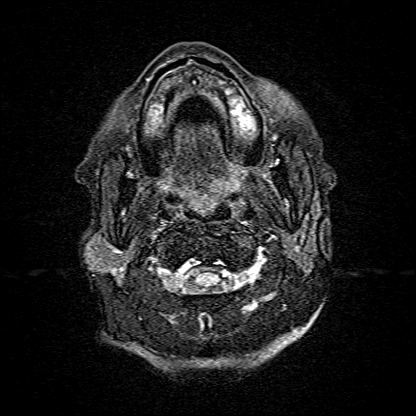
[im 19/55]
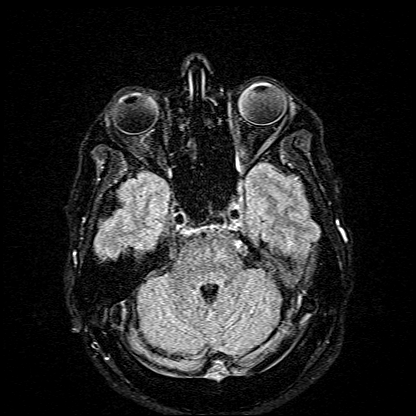
[im 37/55]
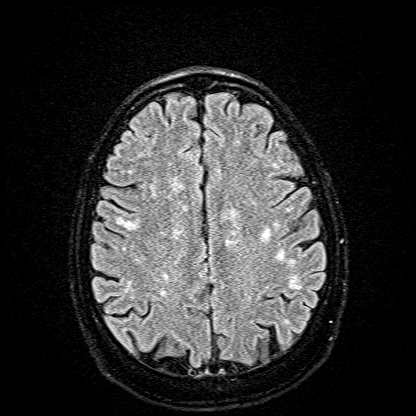
[im 55/55]
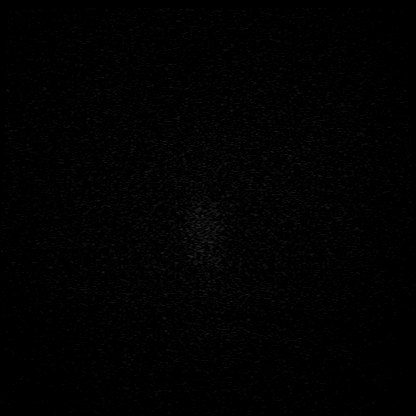

[Series 16: T1 · axial · 1.0mm · 0.98mm/px · z∈[-130,+41]mm · 11 of 175 slices shown (2 of 3)]
[im 1/175]
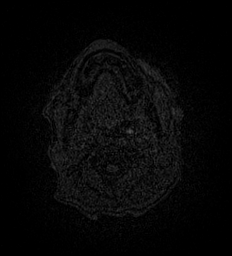
[im 16/175]
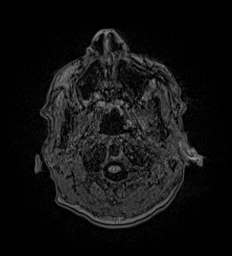
[im 32/175]
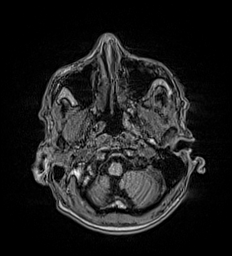
[im 48/175]
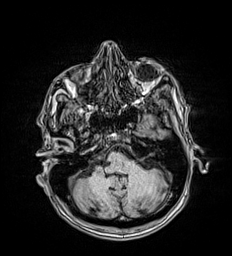
[im 64/175]
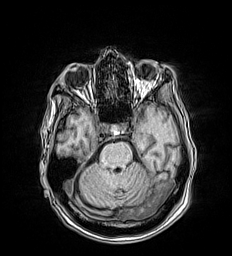
[im 80/175]
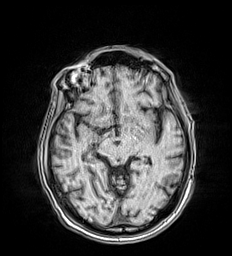
[im 95/175]
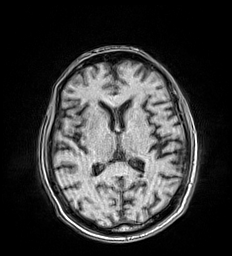
[im 111/175]
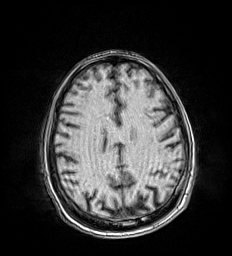
[im 127/175]
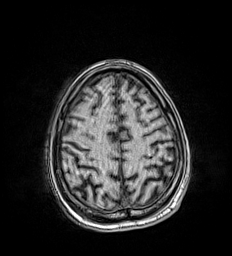
[im 143/175]
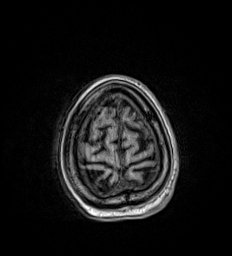
[im 175/175]
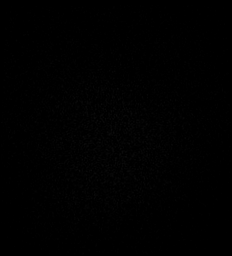

[Series 17: T2 · coronal · 5.0mm · 0.57mm/px · 2 of 29 slices shown (2 of 2)]
[im 1/29]
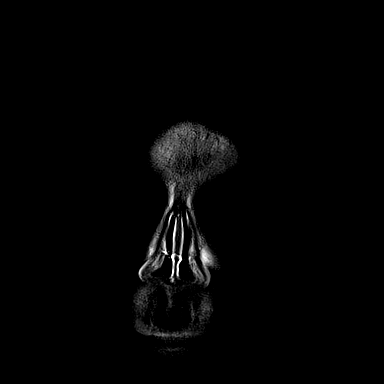
[im 29/29]
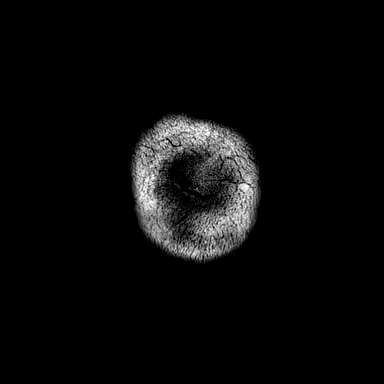

[Series 18: cor dwi_tracew · coronal · 5.0mm · 1.31mm/px · 3 of 38 slices shown (2 of 2)]
[im 1/38]
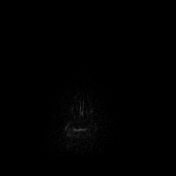
[im 19/38]
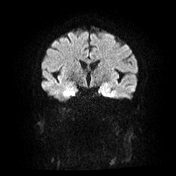
[im 38/38]
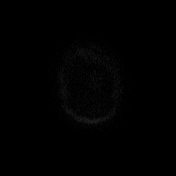

[Series 19: cor dwi_adc · coronal · 5.0mm · 1.31mm/px · 3 of 38 slices shown (2 of 2)]
[im 1/38]
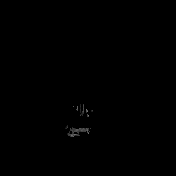
[im 19/38]
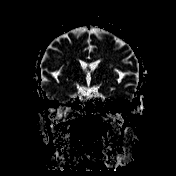
[im 38/38]
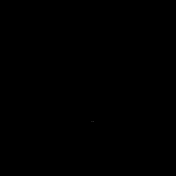

[Series 20: T1 · axial · 5.0mm · 0.90mm/px · z∈[-119,+33]mm · 2 of 27 slices shown (3 of 3)]
[im 1/27]
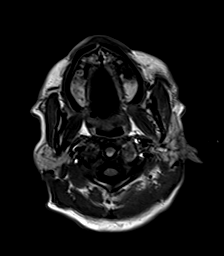
[im 27/27]
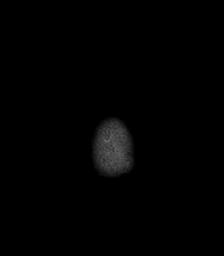

[47 of 48 positions shown; findings below may reference images not displayed]

FINDINGS: Brain: Examination mildly degraded by motion.

Generalized age-related cerebral atrophy. Patchy and confluent
T2/FLAIR hyperintensity within the periventricular, deep, and
subcortical white matter both cerebral hemispheres, most like
related chronic microvascular ischemic disease, moderate nature.
Patchy involvement of the pons noted as well.

No abnormal foci of restricted diffusion to suggest acute or
subacute ischemia. Gray-white matter differentiation maintained. No
encephalomalacia to suggest chronic cortical infarction. No acute
intracranial hemorrhage. Single subcentimeter focus of
susceptibility artifact noted within the parasagittal left occipital
lobe, likely a small chronic microhemorrhage, of doubtful
significance in isolation.

No mass lesion, midline shift or mass effect. No hydrocephalus or
extra-axial fluid collection. Pituitary gland and suprasellar region
within normal limits. Midline structures intact.

Vascular: Major intracranial vascular flow voids are maintained.

Skull and upper cervical spine: Craniocervical junction within
normal limits. Bone marrow signal intensity normal. No scalp soft
tissue abnormality.

Sinuses/Orbits: Globes and orbital soft tissues within normal
limits. Mild scattered mucosal thickening noted within the ethmoidal
air cells and maxillary sinuses. Few small superimposed retention
cyst noted within the maxillary sinuses as well. Paranasal sinuses
are otherwise clear. No mastoid effusion. Inner ear structures
grossly within normal limits.

Other: None.
IMPRESSION: 1. No acute intracranial infarct or other abnormality.
2. Age-related cerebral atrophy with moderate cerebral white matter
disease, nonspecific, but most commonly related to chronic
microvascular ischemic disease.

## 2020-05-26 MED ORDER — PREDNISONE 10 MG PO TABS: 60.0000 mg | ORAL_TABLET | Freq: Every day | ORAL | 0 refills | Status: AC

## 2020-05-26 MED ORDER — LACTATED RINGERS IV BOLUS
1000.0000 mL | Freq: Once | INTRAVENOUS | Status: AC
  Administered 2020-05-26: 1000 mL via INTRAVENOUS

## 2020-05-26 MED ORDER — PROCHLORPERAZINE EDISYLATE 10 MG/2ML IJ SOLN
10.0000 mg | Freq: Once | INTRAMUSCULAR | Status: AC
  Administered 2020-05-26: 10 mg via INTRAVENOUS
  Filled 2020-05-26: qty 2

## 2020-05-26 MED ORDER — TETRACAINE HCL 0.5 % OP SOLN
1.0000 [drp] | Freq: Once | OPHTHALMIC | Status: AC
  Administered 2020-05-26: 2 [drp] via OPHTHALMIC

## 2020-05-26 MED ORDER — IOHEXOL 350 MG/ML SOLN
75.0000 mL | Freq: Once | INTRAVENOUS | Status: AC | PRN
  Administered 2020-05-26: 75 mL via INTRAVENOUS

## 2020-05-26 MED ORDER — SODIUM CHLORIDE 0.9 % IV SOLN
1000.0000 mg | Freq: Once | INTRAVENOUS | Status: AC
  Administered 2020-05-26: 1000 mg via INTRAVENOUS
  Filled 2020-05-26: qty 8

## 2020-05-26 NOTE — ED Triage Notes (Signed)
Arrived by EMS from home for headache, throbbing pain, frontal lobe area. Dizziness when standing. Reports this headache is worse than any other. Liver cancer patient currently. EMS blood sugar 105. Grips strong and equal. Speech clear.

## 2020-05-26 NOTE — Discharge Instructions (Addendum)
Please follow-up with Dr. Edison Pace tomorrow at 815.  He is the eye doctor. 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  This is very important because if you do have temporal arteritis and you are not treated properly you can go blind.  Make sure you get the prednisone prescription filled and take it.  You should get the next dose of prednisone tomorrow.  You had today's dose in the emergency room.  We also want you to follow-up with your doctors to keep an eye on your hepatitis C.  Your liver function tests are a little more elevated than they have been in the past they are not seriously elevated but they bear watching.  Please return for any worsening headache, blurry vision, weakness or abdominal pain or yellow jaundice.

## 2020-05-26 NOTE — ED Provider Notes (Addendum)
[facility] Emergency Department Provider Note  ____________________________________________   First MD Initiated Contact with Patient 05/26/20 1809     (approximate)  I have reviewed the triage vital signs and the nursing notes.   HISTORY  Chief Complaint Migraine, Dizziness, and Weakness   HPI Joshua Ramos is a 69 y.o. male with a past medical history of stage III hepatocellular carcinoma on [place], HTN, tobacco abuse, and remote polysubstance abuse who presents for assessment of headache that began today.  No prior similar headaches, no clear alleviating or aggravating factors.  Denies any photophobia, phonophobia, vision changes, vertigo, or traumatic injuries or falls.  He states that he feels he has been weak in his right side for the past 3 weeks but not on his left.  He also endorses some numbness and tingling in his entire right side for the last 2 weeks.  He denies any chest pain, shortness of breath, cough, abdominal pain, vomiting diarrhea, dysuria, rash, or other acute complaints.  Denies any recent EtOH or illicit drug use.         Past Medical History:  Diagnosis Date  . Cancer ([place])    liver  . Hepatitis A   . Hypertension     Patient Active Problem List   Diagnosis Date Noted  . Goals of care, counseling/discussion 04/23/2020  . Hepatocellular carcinoma ([place]) 04/18/2020  . Substance abuse ([place]) 02/19/2019  . Chronic hepatitis C without hepatic coma ([place]) 06/17/2014  . Tobacco abuse 06/17/2014  . Essential (primary) hypertension 03/20/2014    Past Surgical History:  Procedure Laterality Date  . IR IMAGING GUIDED PORT INSERTION  05/02/2020    Prior to Admission medications   Medication Sig Start Date End Date Taking? Authorizing Provider  buPROPion (WELLBUTRIN SR) 150 MG 12 hr tablet Take 1 tablet by mouth in the morning and at bedtime. 05/15/15   [provider]  capsicum (ZOSTRIX) 0.075 % topical cream  Apply 1 application topically every 8 (eight) hours.  Patient not taking: Reported on 04/22/2020 08/03/11   [provider]  lidocaine-prilocaine (EMLA) cream Apply to affected area once 04/23/20   Lloyd Huger, MD  lisinopril-hydrochlorothiazide (ZESTORETIC) 20-12.5 MG tablet Take 1 tablet by mouth daily. 03/19/20 03/19/21  [provider]  naproxen (NAPROSYN) 500 MG tablet Take 500 mg by mouth every 12 (twelve) hours as needed.  Patient not taking: Reported on 05/02/2020 07/18/19 07/17/20  [provider]  oxyCODONE (OXY IR/ROXICODONE) 5 MG immediate release tablet Take 1 tablet (5 mg total) by mouth every 6 (six) hours as needed for severe pain. 05/15/20   Lloyd Huger, MD  predniSONE (DELTASONE) 10 MG tablet Take 6 tablets (60 mg total) by mouth daily for 5 days. 05/26/20 05/31/20  Lucrezia Starch, MD  prochlorperazine (COMPAZINE) 10 MG tablet Take 1 tablet (10 mg total) by mouth every 6 (six) hours as needed (Nausea or vomiting). 04/23/20   Lloyd Huger, MD    Allergies Patient has no known allergies.  History reviewed. No pertinent family history.  Social History Social History   Tobacco Use  . Smoking status: Former Smoker    Packs/day: 0.50    Types: Cigarettes  . Smokeless tobacco: Never Used  . Tobacco comment: 1 cigarette today  Vaping Use  . Vaping Use: Never used  Substance Use Topics  . Alcohol use: Not Currently  . Drug use: Never    Review of Systems  Review of Systems  Constitutional: Negative  for chills and fever.  HENT: Negative for sore throat.   Eyes: Negative for pain.  Respiratory: Negative for cough and stridor.   Cardiovascular: Negative for chest pain.  Gastrointestinal: Negative for vomiting.  Genitourinary: Negative for dysuria.  Musculoskeletal: Negative for myalgias.  Skin: Negative for rash.  Neurological: Positive for dizziness, weakness and headaches. Negative for seizures and loss of consciousness.    Psychiatric/Behavioral: Negative for suicidal ideas.  All other systems reviewed and are negative.     ____________________________________________   PHYSICAL EXAM:  VITAL SIGNS: ED Triage Vitals  Enc Vitals Group     BP 05/26/20 1407 118/67     Pulse Rate 05/26/20 1407 94     Resp 05/26/20 1407 16     Temp 05/26/20 1407 98.8 F (37.1 C)     Temp Source 05/26/20 1407 Oral     SpO2 05/26/20 1407 97 %     Weight 05/26/20 1410 150 lb (68 kg)     Height 05/26/20 1410 '6\' 1"'  (1.854 m)     Head Circumference --      Peak Flow --      Pain Score 05/26/20 1410 8     Pain Loc --      Pain Edu? --      Excl. in [place]? --    Vitals:   05/26/20 1407  BP: 118/67  Pulse: 94  Resp: 16  Temp: 98.8 F (37.1 C)  SpO2: 97%   Physical Exam Vitals and nursing note reviewed.  Constitutional:      Appearance: He is well-developed.  HENT:     Head: Normocephalic and atraumatic.     Right Ear: External ear normal.     Left Ear: External ear normal.     Nose: Nose normal.     Mouth/Throat:     Mouth: Mucous membranes are dry.  Eyes:     Conjunctiva/sclera: Conjunctivae normal.  Cardiovascular:     Rate and Rhythm: Normal rate and regular rhythm.     Heart sounds: No murmur heard.   Pulmonary:     Effort: Pulmonary effort is normal. No respiratory distress.     Breath sounds: Normal breath sounds.  Abdominal:     Palpations: Abdomen is soft.     Tenderness: There is no abdominal tenderness.  Musculoskeletal:     Cervical back: Neck supple. No rigidity.  Skin:    General: Skin is warm and dry.     Capillary Refill: Capillary refill takes 2 to 3 seconds.  Neurological:     Mental Status: He is alert and oriented to person, place, and time.  Psychiatric:        Mood and Affect: Mood normal.     No pronator drift.  No finger dysmetria.  Cranial Steri-Strips grossly intact.  Sensation is intact to light touch of all extremities.  Patient is slightly weak in the right upper and  right lower extremity compared to left upper and left lower extremities.  Visual acuity is 20/40 bilaterally.   IOP is 10 bilaterally.    ____________________________________________   LABS (all labs ordered are listed, but only abnormal results are displayed)  Labs Reviewed  BASIC METABOLIC PANEL - Abnormal; Notable for the following components:      Result Value   Sodium 132 (*)    Glucose, Bld 116 (*)    BUN 27 (*)    Creatinine, Ser 1.41 (*)    Calcium 8.4 (*)    GFR, Estimated 54 (*)  All other components within normal limits  CBC - Abnormal; Notable for the following components:   RBC 3.59 (*)    Hemoglobin 12.5 (*)    HCT 34.8 (*)    MCH 34.8 (*)    Platelets 148 (*)    All other components within normal limits  URINALYSIS, COMPLETE (UACMP) WITH MICROSCOPIC - Abnormal; Notable for the following components:   Color, Urine AMBER (*)    APPearance HAZY (*)    Bilirubin Urine SMALL (*)    Protein, ur 30 (*)    All other components within normal limits  SEDIMENTATION RATE - Abnormal; Notable for the following components:   Sed Rate 90 (*)    All other components within normal limits  RESPIRATORY PANEL BY RT PCR (FLU A&B, COVID)  HEPATITIS PANEL, ACUTE  C-REACTIVE PROTEIN   ____________________________________________  EKG  Sinus rhythm with a ventricular rate of 79, normal axis, unremarkable intervals, no evidence of acute ischemia or significant underlying arrhythmia. ____________________________________________  RADIOLOGY  ED MD interpretation: No evidence of intracranial hemorrhage, mass-effect, or metastatic process.  Chest x-ray shows evidence of pneumonia, volume overload, pneumothorax, edema, or other acute thoracic process.  Official radiology report(s): CT Angio Head W or Wo Contrast  Result Date: 05/26/2020 CLINICAL DATA:  Dizziness.  Headache. EXAM: CT ANGIOGRAPHY HEAD AND NECK TECHNIQUE: Multidetector CT imaging of the head and neck was performed  using the standard protocol during bolus administration of intravenous contrast. Multiplanar CT image reconstructions and MIPs were obtained to evaluate the vascular anatomy. Carotid stenosis measurements (when applicable) are obtained utilizing NASCET criteria, using the distal internal carotid diameter as the denominator. CONTRAST:  33m OMNIPAQUE IOHEXOL 350 MG/ML SOLN COMPARISON:  None. FINDINGS: CT HEAD FINDINGS Brain: There is no evidence of an acute infarct, intracranial hemorrhage, mass, midline shift, or extra-axial fluid collection. The ventricles and sulci are normal. Scattered hypodensities in the cerebral white matter bilaterally are nonspecific but compatible with mild chronic small vessel ischemic disease. Vascular: Calcified atherosclerosis at the skull base. No hyperdense vessel. Skull: No fracture or suspicious osseous lesion. Sinuses: Focal mucosal thickening superiorly in the left maxillary sinus. Clear mastoid air cells. Orbits: Unremarkable. Review of the MIP images confirms the above findings CTA NECK FINDINGS Aortic arch: Standard 3 vessel aortic arch with mild atherosclerotic plaque. Widely patent arch vessel origins. Right carotid system: Patent without evidence of stenosis, dissection, or significant atherosclerosis. Left carotid system: Patent without evidence of stenosis, dissection, or significant atherosclerosis. Vertebral arteries: Patent and codominant without evidence of stenosis, dissection, or significant atherosclerosis. Skeleton: Edentulous. Advanced multilevel cervical facet arthrosis. Moderate disc degeneration at C6-7. Other neck: No definite evidence of cervical lymphadenopathy or mass although assessment is limited by paucity of fat and contrast timing which was not tailored for evaluation of the soft tissues. Upper chest: Moderate centrilobular and paraseptal emphysema. Partially visualized right jugular Port-A-Cath. Review of the MIP images confirms the above findings CTA  HEAD FINDINGS Anterior circulation: The internal carotid arteries are patent from skull base to carotid termini with atherosclerosis resulting in mild right greater than left cavernous segment stenoses. ACAs and MCAs are patent without evidence of a significant A1 or M1 stenosis or proximal branch occlusion. Diffusely irregular appearance of the branch vessels on reformats may reflect a combination of artifact from image noise and atherosclerosis. No aneurysm is identified. Posterior circulation: The intracranial vertebral arteries are widely patent to the basilar. Patent left PICA, right AICA, and bilateral SCA origins are visualized. The basilar artery  is widely patent. There is a small left posterior communicating artery. Both PCAs are patent without evidence of a significant proximal stenosis on the right. There is a moderate proximal left P2 stenosis. No aneurysm is identified. Venous sinuses: Patent. Anatomic variants: None. Review of the MIP images confirms the above findings IMPRESSION: 1. No evidence of acute intracranial abnormality. 2. Mild chronic small vessel ischemic disease. 3. Intracranial atherosclerosis with mild bilateral ICA and moderate left P2 stenoses. 4. Widely patent cervical carotid and vertebral arteries. 5. Aortic Atherosclerosis (ICD10-I70.0) and Emphysema (ICD10-J43.9). Electronically Signed   By: Logan Bores M.D.   On: 05/26/2020 20:22   DG Chest 2 View  Result Date: 05/26/2020 CLINICAL DATA:  Dizziness.  History of hepatocellular carcinoma. EXAM: CHEST - 2 VIEW COMPARISON:  Included lung apices from neck CTA earlier today. PET CT 04/29/2020 FINDINGS: Right chest port in place. Emphysema and bronchial thickening. Heart is normal in size with normal mediastinal contours. Aortic atherosclerosis. No focal airspace disease, pleural effusion, or pneumothorax. No acute osseous abnormalities are seen. IMPRESSION: Emphysema and bronchial thickening.  No superimposed acute findings.  Electronically Signed   By: Keith Rake M.D.   On: 05/26/2020 20:42   CT Angio Neck W and/or Wo Contrast  Result Date: 05/26/2020 CLINICAL DATA:  Dizziness.  Headache. EXAM: CT ANGIOGRAPHY HEAD AND NECK TECHNIQUE: Multidetector CT imaging of the head and neck was performed using the standard protocol during bolus administration of intravenous contrast. Multiplanar CT image reconstructions and MIPs were obtained to evaluate the vascular anatomy. Carotid stenosis measurements (when applicable) are obtained utilizing NASCET criteria, using the distal internal carotid diameter as the denominator. CONTRAST:  56m OMNIPAQUE IOHEXOL 350 MG/ML SOLN COMPARISON:  None. FINDINGS: CT HEAD FINDINGS Brain: There is no evidence of an acute infarct, intracranial hemorrhage, mass, midline shift, or extra-axial fluid collection. The ventricles and sulci are normal. Scattered hypodensities in the cerebral white matter bilaterally are nonspecific but compatible with mild chronic small vessel ischemic disease. Vascular: Calcified atherosclerosis at the skull base. No hyperdense vessel. Skull: No fracture or suspicious osseous lesion. Sinuses: Focal mucosal thickening superiorly in the left maxillary sinus. Clear mastoid air cells. Orbits: Unremarkable. Review of the MIP images confirms the above findings CTA NECK FINDINGS Aortic arch: Standard 3 vessel aortic arch with mild atherosclerotic plaque. Widely patent arch vessel origins. Right carotid system: Patent without evidence of stenosis, dissection, or significant atherosclerosis. Left carotid system: Patent without evidence of stenosis, dissection, or significant atherosclerosis. Vertebral arteries: Patent and codominant without evidence of stenosis, dissection, or significant atherosclerosis. Skeleton: Edentulous. Advanced multilevel cervical facet arthrosis. Moderate disc degeneration at C6-7. Other neck: No definite evidence of cervical lymphadenopathy or mass although  assessment is limited by paucity of fat and contrast timing which was not tailored for evaluation of the soft tissues. Upper chest: Moderate centrilobular and paraseptal emphysema. Partially visualized right jugular Port-A-Cath. Review of the MIP images confirms the above findings CTA HEAD FINDINGS Anterior circulation: The internal carotid arteries are patent from skull base to carotid termini with atherosclerosis resulting in mild right greater than left cavernous segment stenoses. ACAs and MCAs are patent without evidence of a significant A1 or M1 stenosis or proximal branch occlusion. Diffusely irregular appearance of the branch vessels on reformats may reflect a combination of artifact from image noise and atherosclerosis. No aneurysm is identified. Posterior circulation: The intracranial vertebral arteries are widely patent to the basilar. Patent left PICA, right AICA, and bilateral SCA origins are visualized. The basilar  artery is widely patent. There is a small left posterior communicating artery. Both PCAs are patent without evidence of a significant proximal stenosis on the right. There is a moderate proximal left P2 stenosis. No aneurysm is identified. Venous sinuses: Patent. Anatomic variants: None. Review of the MIP images confirms the above findings IMPRESSION: 1. No evidence of acute intracranial abnormality. 2. Mild chronic small vessel ischemic disease. 3. Intracranial atherosclerosis with mild bilateral ICA and moderate left P2 stenoses. 4. Widely patent cervical carotid and vertebral arteries. 5. Aortic Atherosclerosis (ICD10-I70.0) and Emphysema (ICD10-J43.9). Electronically Signed   By: Logan Bores M.D.   On: 05/26/2020 20:22    ____________________________________________   PROCEDURES  Procedure(s) performed (including Critical Care):  .1-3 Lead EKG Interpretation Performed by: Lucrezia Starch, MD Authorized by: Lucrezia Starch, MD     Interpretation: normal     ECG rate  assessment: normal     Rhythm: sinus rhythm     Ectopy: none     Conduction: normal       ____________________________________________   INITIAL IMPRESSION / ASSESSMENT AND PLAN / ED COURSE        Patient presents for assessment of headache associate with some dizziness began today associate with severe headache and approximately 3 weeks of right hemibody weakness.  Patient is afebrile hemodynamically stable on arrival.  Exam as above remarkable for some weakness in the right upper and lower extremities compared to the left is very subtle.  No other clear neuro deficits.  Differential includes but is not limited to CVA, temporal arteritis, complex migraine, brain metastases from known cancer, and metabolic derangements.  No history or exam findings to suggest an acute traumatic injury.  No findings suggest cavernous venous thrombosis.  Patient does appear dehydrated  CT shows no evidence of metastatic disease.  There is no significant vessel disease or occlusion.  No evidence of CVA or infarct on CTA head and neck.  Patient is noted to have some atherosclerosis but no other acute intracranial abnormality.  I have he is not consistent with acute angle-closure glaucoma.  BMP does show evidence of an AKI which is very mild with a creatinine of 1.41 compared to 1.082 weeks ago. No significant ocular or metabolic derangements identified on BMP.  CBC is unremarkable.  UA has some small bilirubin and protein but otherwise is unremarkable.  ESR is elevated at 90.  Given concern for temporal arteritis patient that is over age 89 with new onset headache and elevated ESR I did discuss patient's presentation with on-call ophthalmologist Dr. Edison Pace who recommended giving the patient 1 dose of methylprednisolone which was administered as noted below and if otherwise his work-up is reassuring without evidence of stroke he recommends follow-up in ophthalmology clinic tomorrow morning at 8:15 AM.  Also  recommended Rx for prednisone.  Care of patient was assumed by Dr. [place] at approximately 9 PM.  Plan is to follow-up MRI and hepatic function panel.  If there are no findings on the studies that require admission plan is to discharge with ophthalmology follow-up tomorrow morning and Rx for prednisone.  I did advise patient that he seemed dehydrated on his left and his kidney function was reduced and regardless of that she was can go home or be admitted to the hospital he must have his kidney function rechecked in a couple of days.  ____________________________________________   FINAL CLINICAL IMPRESSION(S) / ED DIAGNOSES  Final diagnoses:  Acute nonintractable headache, unspecified headache type  AKI (acute kidney  injury) ([place])  Weakness  ESR raised  Blurry vision    Medications  lactated ringers bolus 1,000 mL (has no administration in time range)  tetracaine (PONTOCAINE) 0.5 % ophthalmic solution 1-2 drop (has no administration in time range)  methylPREDNISolone sodium succinate (SOLU-MEDROL) 1,000 mg in sodium chloride 0.9 % 50 mL IVPB (has no administration in time range)  prochlorperazine (COMPAZINE) injection 10 mg (10 mg Intravenous Given 05/26/20 1906)  iohexol (OMNIPAQUE) 350 MG/ML injection 75 mL (75 mLs Intravenous Contrast Given 05/26/20 1944)     ED Discharge Orders         Ordered    predniSONE (DELTASONE) 10 MG tablet  Daily        05/26/20 2051           Note:  This document was prepared using Dragon voice recognition software and may include unintentional dictation errors.   Lucrezia Starch, MD 05/26/20 2056    Lucrezia Starch, MD 05/26/20 [id number]

## 2020-05-26 NOTE — ED Notes (Signed)
Pt called for reassessment, no answer at this time.

## 2020-05-27 LAB — HEPATITIS PANEL, ACUTE
HCV Ab: REACTIVE — AB
Hep A IgM: NONREACTIVE
Hep B C IgM: NONREACTIVE
Hepatitis B Surface Ag: NONREACTIVE

## 2020-05-27 LAB — C-REACTIVE PROTEIN: CRP: 15.7 mg/dL — ABNORMAL HIGH (ref <1.0)

## 2020-05-27 NOTE — ED Notes (Signed)
VSS, discharge instructions reviewed with patient who verb. Understanding of same.  Pt discharged home with friend.

## 2020-05-27 NOTE — ED Notes (Signed)
Implanted port in right upper chest removed intact.  Site flushes well and able to pull back prior to needle removal.

## 2020-05-28 ENCOUNTER — Other Ambulatory Visit: Payer: Medicare HMO

## 2020-05-28 ENCOUNTER — Ambulatory Visit: Payer: Medicare HMO | Admitting: Oncology

## 2020-05-28 ENCOUNTER — Inpatient Hospital Stay: Payer: Medicare HMO

## 2020-05-28 ENCOUNTER — Encounter: Payer: Self-pay | Admitting: Oncology

## 2020-05-28 ENCOUNTER — Other Ambulatory Visit: Payer: Self-pay

## 2020-05-28 ENCOUNTER — Ambulatory Visit: Payer: Medicare HMO

## 2020-05-28 ENCOUNTER — Inpatient Hospital Stay: Payer: Medicare HMO | Attending: Oncology | Admitting: Oncology

## 2020-05-28 VITALS — BP 130/85 | HR 65 | Temp 97.8°F | Resp 20 | Ht 73.0 in | Wt 151.7 lb

## 2020-05-28 DIAGNOSIS — E860 Dehydration: Secondary | ICD-10-CM

## 2020-05-28 DIAGNOSIS — Z79899 Other long term (current) drug therapy: Secondary | ICD-10-CM | POA: Insufficient documentation

## 2020-05-28 DIAGNOSIS — C220 Liver cell carcinoma: Secondary | ICD-10-CM | POA: Diagnosis present

## 2020-05-28 DIAGNOSIS — N179 Acute kidney failure, unspecified: Secondary | ICD-10-CM | POA: Diagnosis not present

## 2020-05-28 DIAGNOSIS — R7989 Other specified abnormal findings of blood chemistry: Secondary | ICD-10-CM

## 2020-05-28 DIAGNOSIS — Z5111 Encounter for antineoplastic chemotherapy: Secondary | ICD-10-CM | POA: Insufficient documentation

## 2020-05-28 DIAGNOSIS — D72829 Elevated white blood cell count, unspecified: Secondary | ICD-10-CM | POA: Diagnosis not present

## 2020-05-28 LAB — COMPREHENSIVE METABOLIC PANEL
ALT: 48 U/L — ABNORMAL HIGH (ref 0–44)
AST: 79 U/L — ABNORMAL HIGH (ref 15–41)
Albumin: 1.9 g/dL — ABNORMAL LOW (ref 3.5–5.0)
Alkaline Phosphatase: 189 U/L — ABNORMAL HIGH (ref 38–126)
Anion gap: 6 (ref 5–15)
BUN: 59 mg/dL — ABNORMAL HIGH (ref 8–23)
CO2: 25 mmol/L (ref 22–32)
Calcium: 8.7 mg/dL — ABNORMAL LOW (ref 8.9–10.3)
Chloride: 103 mmol/L (ref 98–111)
Creatinine, Ser: 2.12 mg/dL — ABNORMAL HIGH (ref 0.61–1.24)
GFR, Estimated: 33 mL/min — ABNORMAL LOW (ref >60)
Glucose, Bld: 192 mg/dL — ABNORMAL HIGH (ref 70–99)
Potassium: 4.5 mmol/L (ref 3.5–5.1)
Sodium: 134 mmol/L — ABNORMAL LOW (ref 135–145)
Total Bilirubin: 1.7 mg/dL — ABNORMAL HIGH (ref 0.3–1.2)
Total Protein: 7.1 g/dL (ref 6.5–8.1)

## 2020-05-28 LAB — CBC WITH DIFFERENTIAL/PLATELET
Abs Immature Granulocytes: 0.17 10*3/uL — ABNORMAL HIGH (ref 0.00–0.07)
Basophils Absolute: 0 10*3/uL (ref 0.0–0.1)
Basophils Relative: 0 %
Eosinophils Absolute: 0 10*3/uL (ref 0.0–0.5)
Eosinophils Relative: 0 %
HCT: 32.8 % — ABNORMAL LOW (ref 39.0–52.0)
Hemoglobin: 11.5 g/dL — ABNORMAL LOW (ref 13.0–17.0)
Immature Granulocytes: 1 %
Lymphocytes Relative: 7 %
Lymphs Abs: 1 10*3/uL (ref 0.7–4.0)
MCH: 33.9 pg (ref 26.0–34.0)
MCHC: 35.1 g/dL (ref 30.0–36.0)
MCV: 96.8 fL (ref 80.0–100.0)
Monocytes Absolute: 0.9 10*3/uL (ref 0.1–1.0)
Monocytes Relative: 7 %
Neutro Abs: 11.9 10*3/uL — ABNORMAL HIGH (ref 1.7–7.7)
Neutrophils Relative %: 85 %
Platelets: 154 10*3/uL (ref 150–400)
RBC: 3.39 MIL/uL — ABNORMAL LOW (ref 4.22–5.81)
RDW: 15.1 % (ref 11.5–15.5)
WBC: 14 10*3/uL — ABNORMAL HIGH (ref 4.0–10.5)
nRBC: 0 % (ref 0.0–0.2)

## 2020-05-28 LAB — TSH: TSH: 2.121 u[IU]/mL (ref 0.350–4.500)

## 2020-05-28 MED ORDER — SODIUM CHLORIDE 0.9 % IV SOLN
INTRAVENOUS | Status: DC
  Filled 2020-05-28 (×2): qty 250

## 2020-05-28 MED ORDER — SODIUM CHLORIDE 0.9% FLUSH
10.0000 mL | Freq: Once | INTRAVENOUS | Status: AC
  Administered 2020-05-28: 10 mL via INTRAVENOUS
  Filled 2020-05-28: qty 10

## 2020-05-28 MED ORDER — HEPARIN SOD (PORK) LOCK FLUSH 100 UNIT/ML IV SOLN
500.0000 [IU] | Freq: Once | INTRAVENOUS | Status: AC
  Administered 2020-05-28: 500 [IU]
  Filled 2020-05-28: qty 5

## 2020-05-28 MED ORDER — HEPARIN SOD (PORK) LOCK FLUSH 100 UNIT/ML IV SOLN
INTRAVENOUS | Status: AC
  Filled 2020-05-28: qty 5

## 2020-05-28 NOTE — Progress Notes (Signed)
[place]  [phone] [phone]) [phone]  ID: Joshua Ramos OB: [birth year]  MR#: [id number]  MGN#:[id number]  Patient Care Team: Romualdo Bolk, FNP as PCP - General (Nurse Practitioner) Clent Jacks, RN as Oncology Nurse Navigator  CHIEF COMPLAINT: Stage IIIa [facility]  INTERVAL HISTORY: Patient returns to clinic today for assessment prior to cycle 2 Tecentriq.  Avastin is on hold secondary to possible Y 90 ablation in the near future.  He received his first cycle of Tecentriq on 05/07/2020.  In the interim, he was evaluated in the emergency room for migraine, dizziness and weakness.  Work-up included CT of his brain which was negative for metastatic disease and no significant vessel disease or occlusion.  There was no evidence of CVA or infarct.  There was no significant metabolic derangements.  His ESR was elevated at 90.  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  He was given 1 dose of methyl prednisolone and a prescription for oral prednisone at discharge.  He had next day follow-up with ophthalmology.  He did not make follow-up appointments.  He presents today and states he is feeling "well".  He denies any dizziness, weakness or recurrent migraines or headaches.  He is almost completed his prednisone from the emergency room.  He denies any nausea or vomiting.  He feels that he is eating and drinking well.  He continues to have right-sided numbness and tingling that is chronic and stable.  Denies any constipation or diarrhea.  Denies any urinary retention.  REVIEW OF SYSTEMS:   Review of Systems  Constitutional: Negative.  Negative for fever, malaise/fatigue and weight loss.  Respiratory: Negative.  Negative for cough and shortness of breath.   Cardiovascular: Negative.  Negative for chest pain and leg swelling.  Gastrointestinal: Negative.  Negative for abdominal pain, blood in stool, constipation, nausea and  vomiting.  Genitourinary: Positive for flank pain.  Musculoskeletal: Negative for back pain.  Skin: Negative.  Negative for rash.  Neurological: Positive for focal weakness. Negative for dizziness, weakness and headaches.  Psychiatric/Behavioral: Negative.  The patient is not nervous/anxious.     As per HPI. Otherwise, a complete review of systems is negative.  PAST MEDICAL HISTORY: Past Medical History:  Diagnosis Date  . Cancer ([place])    liver  . Hepatitis A   . Hypertension     PAST SURGICAL HISTORY: Past Surgical History:  Procedure Laterality Date  . IR IMAGING GUIDED PORT INSERTION  05/02/2020    FAMILY HISTORY: History reviewed. No pertinent family history.  ADVANCED DIRECTIVES (Y/N):  N  HEALTH MAINTENANCE: Social History   Tobacco Use  . Smoking status: Former Smoker    Packs/day: 0.50    Types: Cigarettes  . Smokeless tobacco: Never Used  . Tobacco comment: 1 cigarette today  Vaping Use  . Vaping Use: Never used  Substance Use Topics  . Alcohol use: Not Currently  . Drug use: Never     Colonoscopy:  PAP:  Bone density:  Lipid panel:  No Known Allergies  Current Outpatient Medications  Medication Sig Dispense Refill  . buPROPion (WELLBUTRIN SR) 150 MG 12 hr tablet Take 1 tablet by mouth in the morning and at bedtime.    . lidocaine-prilocaine (EMLA) cream Apply to affected area once 30 g 3  . lisinopril-hydrochlorothiazide (ZESTORETIC) 20-12.5 MG tablet Take 1 tablet by mouth daily.    . naproxen (NAPROSYN) 500 MG tablet Take 500 mg by mouth every 12 (twelve) hours  as needed.     Marland Kitchen oxyCODONE (OXY IR/ROXICODONE) 5 MG immediate release tablet Take 1 tablet (5 mg total) by mouth every 6 (six) hours as needed for severe pain. 30 tablet 0  . predniSONE (DELTASONE) 10 MG tablet Take 6 tablets (60 mg total) by mouth daily for 5 days. 30 tablet 0  . prochlorperazine (COMPAZINE) 10 MG tablet Take 1 tablet (10 mg total) by mouth every 6 (six) hours as needed  (Nausea or vomiting). 60 tablet 2  . capsicum (ZOSTRIX) 0.075 % topical cream Apply 1 application topically every 8 (eight) hours.  (Patient not taking: Reported on 05/28/2020)     No current facility-administered medications for this visit.   Facility-Administered Medications Ordered in Other Visits  Medication Dose Route Frequency Provider Last Rate Last Admin  . 0.9 %  sodium chloride infusion   Intravenous Continuous Jacquelin Hawking, NP   Paused at 05/28/20 1212    OBJECTIVE: Vitals:   05/28/20 1036  BP: 130/85  Pulse: 65  Resp: 20  Temp: 97.8 F (36.6 C)  SpO2: 100%     Body mass index is 20.01 kg/m.    ECOG FS:1 - Symptomatic but completely ambulatory  General: Thin, no acute distress. Eyes: Pink conjunctiva, anicteric sclera. HEENT: Normocephalic, moist mucous membranes. Lungs: No audible wheezing or coughing. Heart: Regular rate and rhythm. Abdomen: Soft, nontender, no obvious distention. Musculoskeletal: No edema, cyanosis, or clubbing. Neuro: Alert, answering all questions appropriately. Cranial nerves grossly intact. Skin: No rashes or petechiae noted. Psych: Normal affect.   LAB RESULTS:  Lab Results  Component Value Date   NA 134 (L) 05/28/2020   K 4.5 05/28/2020   CL 103 05/28/2020   CO2 25 05/28/2020   GLUCOSE 192 (H) 05/28/2020   BUN 59 (H) 05/28/2020   CREATININE 2.12 (H) 05/28/2020   CALCIUM 8.7 (L) 05/28/2020   PROT 7.1 05/28/2020   ALBUMIN 1.9 (L) 05/28/2020   AST 79 (H) 05/28/2020   ALT 48 (H) 05/28/2020   ALKPHOS 189 (H) 05/28/2020   BILITOT 1.7 (H) 05/28/2020   GFRNONAA 33 (L) 05/28/2020   GFRAA >60 04/04/2020    Lab Results  Component Value Date   WBC 14.0 (H) 05/28/2020   NEUTROABS 11.9 (H) 05/28/2020   HGB 11.5 (L) 05/28/2020   HCT 32.8 (L) 05/28/2020   MCV 96.8 05/28/2020   PLT 154 05/28/2020     STUDIES: CT Angio Head W or Wo Contrast  Result Date: 05/26/2020 CLINICAL DATA:  Dizziness.  Headache. EXAM: CT ANGIOGRAPHY  HEAD AND NECK TECHNIQUE: Multidetector CT imaging of the head and neck was performed using the standard protocol during bolus administration of intravenous contrast. Multiplanar CT image reconstructions and MIPs were obtained to evaluate the vascular anatomy. Carotid stenosis measurements (when applicable) are obtained utilizing NASCET criteria, using the distal internal carotid diameter as the denominator. CONTRAST:  71m OMNIPAQUE IOHEXOL 350 MG/ML SOLN COMPARISON:  None. FINDINGS: CT HEAD FINDINGS Brain: There is no evidence of an acute infarct, intracranial hemorrhage, mass, midline shift, or extra-axial fluid collection. The ventricles and sulci are normal. Scattered hypodensities in the cerebral white matter bilaterally are nonspecific but compatible with mild chronic small vessel ischemic disease. Vascular: Calcified atherosclerosis at the skull base. No hyperdense vessel. Skull: No fracture or suspicious osseous lesion. Sinuses: Focal mucosal thickening superiorly in the left maxillary sinus. Clear mastoid air cells. Orbits: Unremarkable. Review of the MIP images confirms the above findings CTA NECK FINDINGS Aortic arch: Standard 3 vessel aortic  arch with mild atherosclerotic plaque. Widely patent arch vessel origins. Right carotid system: Patent without evidence of stenosis, dissection, or significant atherosclerosis. Left carotid system: Patent without evidence of stenosis, dissection, or significant atherosclerosis. Vertebral arteries: Patent and codominant without evidence of stenosis, dissection, or significant atherosclerosis. Skeleton: Edentulous. Advanced multilevel cervical facet arthrosis. Moderate disc degeneration at C6-7. Other neck: No definite evidence of cervical lymphadenopathy or mass although assessment is limited by paucity of fat and contrast timing which was not tailored for evaluation of the soft tissues. Upper chest: Moderate centrilobular and paraseptal emphysema. Partially  visualized right jugular Port-A-Cath. Review of the MIP images confirms the above findings CTA HEAD FINDINGS Anterior circulation: The internal carotid arteries are patent from skull base to carotid termini with atherosclerosis resulting in mild right greater than left cavernous segment stenoses. ACAs and MCAs are patent without evidence of a significant A1 or M1 stenosis or proximal branch occlusion. Diffusely irregular appearance of the branch vessels on reformats may reflect a combination of artifact from image noise and atherosclerosis. No aneurysm is identified. Posterior circulation: The intracranial vertebral arteries are widely patent to the basilar. Patent left PICA, right AICA, and bilateral SCA origins are visualized. The basilar artery is widely patent. There is a small left posterior communicating artery. Both PCAs are patent without evidence of a significant proximal stenosis on the right. There is a moderate proximal left P2 stenosis. No aneurysm is identified. Venous sinuses: Patent. Anatomic variants: None. Review of the MIP images confirms the above findings IMPRESSION: 1. No evidence of acute intracranial abnormality. 2. Mild chronic small vessel ischemic disease. 3. Intracranial atherosclerosis with mild bilateral ICA and moderate left P2 stenoses. 4. Widely patent cervical carotid and vertebral arteries. 5. Aortic Atherosclerosis (ICD10-I70.0) and Emphysema (ICD10-J43.9). Electronically Signed   By: Logan Bores M.D.   On: 05/26/2020 20:22   DG Chest 2 View  Result Date: 05/26/2020 CLINICAL DATA:  Dizziness.  History of hepatocellular carcinoma. EXAM: CHEST - 2 VIEW COMPARISON:  Included lung apices from neck CTA earlier today. PET CT 04/29/2020 FINDINGS: Right chest port in place. Emphysema and bronchial thickening. Heart is normal in size with normal mediastinal contours. Aortic atherosclerosis. No focal airspace disease, pleural effusion, or pneumothorax. No acute osseous abnormalities  are seen. IMPRESSION: Emphysema and bronchial thickening.  No superimposed acute findings. Electronically Signed   By: Keith Rake M.D.   On: 05/26/2020 20:42   CT Angio Neck W and/or Wo Contrast  Result Date: 05/26/2020 CLINICAL DATA:  Dizziness.  Headache. EXAM: CT ANGIOGRAPHY HEAD AND NECK TECHNIQUE: Multidetector CT imaging of the head and neck was performed using the standard protocol during bolus administration of intravenous contrast. Multiplanar CT image reconstructions and MIPs were obtained to evaluate the vascular anatomy. Carotid stenosis measurements (when applicable) are obtained utilizing NASCET criteria, using the distal internal carotid diameter as the denominator. CONTRAST:  45m OMNIPAQUE IOHEXOL 350 MG/ML SOLN COMPARISON:  None. FINDINGS: CT HEAD FINDINGS Brain: There is no evidence of an acute infarct, intracranial hemorrhage, mass, midline shift, or extra-axial fluid collection. The ventricles and sulci are normal. Scattered hypodensities in the cerebral white matter bilaterally are nonspecific but compatible with mild chronic small vessel ischemic disease. Vascular: Calcified atherosclerosis at the skull base. No hyperdense vessel. Skull: No fracture or suspicious osseous lesion. Sinuses: Focal mucosal thickening superiorly in the left maxillary sinus. Clear mastoid air cells. Orbits: Unremarkable. Review of the MIP images confirms the above findings CTA NECK FINDINGS Aortic arch: Standard 3 vessel  aortic arch with mild atherosclerotic plaque. Widely patent arch vessel origins. Right carotid system: Patent without evidence of stenosis, dissection, or significant atherosclerosis. Left carotid system: Patent without evidence of stenosis, dissection, or significant atherosclerosis. Vertebral arteries: Patent and codominant without evidence of stenosis, dissection, or significant atherosclerosis. Skeleton: Edentulous. Advanced multilevel cervical facet arthrosis. Moderate disc  degeneration at C6-7. Other neck: No definite evidence of cervical lymphadenopathy or mass although assessment is limited by paucity of fat and contrast timing which was not tailored for evaluation of the soft tissues. Upper chest: Moderate centrilobular and paraseptal emphysema. Partially visualized right jugular Port-A-Cath. Review of the MIP images confirms the above findings CTA HEAD FINDINGS Anterior circulation: The internal carotid arteries are patent from skull base to carotid termini with atherosclerosis resulting in mild right greater than left cavernous segment stenoses. ACAs and MCAs are patent without evidence of a significant A1 or M1 stenosis or proximal branch occlusion. Diffusely irregular appearance of the branch vessels on reformats may reflect a combination of artifact from image noise and atherosclerosis. No aneurysm is identified. Posterior circulation: The intracranial vertebral arteries are widely patent to the basilar. Patent left PICA, right AICA, and bilateral SCA origins are visualized. The basilar artery is widely patent. There is a small left posterior communicating artery. Both PCAs are patent without evidence of a significant proximal stenosis on the right. There is a moderate proximal left P2 stenosis. No aneurysm is identified. Venous sinuses: Patent. Anatomic variants: None. Review of the MIP images confirms the above findings IMPRESSION: 1. No evidence of acute intracranial abnormality. 2. Mild chronic small vessel ischemic disease. 3. Intracranial atherosclerosis with mild bilateral ICA and moderate left P2 stenoses. 4. Widely patent cervical carotid and vertebral arteries. 5. Aortic Atherosclerosis (ICD10-I70.0) and Emphysema (ICD10-J43.9). Electronically Signed   By: Logan Bores M.D.   On: 05/26/2020 20:22   MR BRAIN WO CONTRAST  Result Date: 05/26/2020 CLINICAL DATA:  Initial evaluation for neuro deficit, dizziness with severe headache. History of hepatocellular  carcinoma. Six EXAM: MRI HEAD WITHOUT CONTRAST TECHNIQUE: Multiplanar, multiecho pulse sequences of the brain and surrounding structures were obtained without intravenous contrast. COMPARISON:  Prior CT from earlier the same day. FINDINGS: Brain: Examination mildly degraded by motion. Generalized age-related cerebral atrophy. Patchy and confluent T2/FLAIR hyperintensity within the periventricular, deep, and subcortical white matter both cerebral hemispheres, most like related chronic microvascular ischemic disease, moderate nature. Patchy involvement of the pons noted as well. No abnormal foci of restricted diffusion to suggest acute or subacute ischemia. Gray-white matter differentiation maintained. No encephalomalacia to suggest chronic cortical infarction. No acute intracranial hemorrhage. Single subcentimeter focus of susceptibility artifact noted within the parasagittal left occipital lobe, likely a small chronic microhemorrhage, of doubtful significance in isolation. No mass lesion, midline shift or mass effect. No hydrocephalus or extra-axial fluid collection. Pituitary gland and suprasellar region within normal limits. Midline structures intact. Vascular: Major intracranial vascular flow voids are maintained. Skull and upper cervical spine: Craniocervical junction within normal limits. Bone marrow signal intensity normal. No scalp soft tissue abnormality. Sinuses/Orbits: Globes and orbital soft tissues within normal limits. Mild scattered mucosal thickening noted within the ethmoidal air cells and maxillary sinuses. Few small superimposed retention cyst noted within the maxillary sinuses as well. Paranasal sinuses are otherwise clear. No mastoid effusion. Inner ear structures grossly within normal limits. Other: None. IMPRESSION: 1. No acute intracranial infarct or other abnormality. 2. Age-related cerebral atrophy with moderate cerebral white matter disease, nonspecific, but most commonly related to chronic  microvascular ischemic disease. Electronically Signed   By: Jeannine Boga M.D.   On: 05/26/2020 22:27   NM PET Image Initial (PI) Skull Base To Thigh  Result Date: 04/29/2020 CLINICAL DATA:  Initial treatment strategy for hepatocellular carcinoma. EXAM: NUCLEAR MEDICINE PET SKULL BASE TO THIGH TECHNIQUE: 8.0 mCi F-18 FDG was injected intravenously. Full-ring PET imaging was performed from the skull base to thigh after the radiotracer. CT data was obtained and used for attenuation correction and anatomic localization. Fasting blood glucose: 88 mg/dl COMPARISON:  Multiple exams, including CT abdomen 04/01/2020 FINDINGS: Mediastinal blood pool activity: SUV max 2.1 Liver activity: SUV max NA NECK: No significant abnormal hypermetabolic activity in this region. Incidental CT findings: Mild chronic left maxillary sinusitis. CHEST: No significant abnormal hypermetabolic activity in this region. Incidental CT findings: Atherosclerotic thoracic aorta. Paraseptal and centrilobular emphysema. ABDOMEN/PELVIS: The multifocal hepatocellular carcinoma scattered throughout the liver has a very similar activity level to the surrounding hepatic parenchyma. In a representative region of primarily normal appearing parenchyma on the prior CT, background hepatic activity has a maximum SUV of about 3.0. In the vicinity of the lobular tumor from the inferior margin of the left hepatic lobe which underwent biopsy, maximum SUV is 3.1. In the infiltrated portion of the lateral segment left hepatic lobe, primarily segment 3, maximum SUV is 3.4. In the vicinity of a tumor in segment 4a of the liver shown on recent CT abdomen, maximum SUV is 2.8. Aside from the contour abnormalities in the liver, the tumor is surprisingly inconspicuous against the background hepatic parenchyma by PET-CT. Incidental CT findings: Hepatic cirrhosis. Mild ascites with omental and mesenteric edema. Malignant ascites is not excluded although the appearance  could potentially be from benign third spacing of fluid. Aortoiliac atherosclerotic vascular disease. SKELETON: No significant abnormal hypermetabolic activity in this region. Incidental CT findings: Chronic appearing anterior wedging at L2. Spondylosis and degenerative disc disease at L5-S1. IMPRESSION: 1. The multifocal hepatocellular carcinoma scattered throughout the hepatic parenchyma has a activity level almost identical to the surrounding hepatic parenchyma, yielding poor conspicuity against the background liver activity. 2. Ascites with mesenteric and omental edema. I cannot completely exclude the possibility of malignant ascites although benign third spacing of fluid related to underlying cirrhosis is a distinct possibility as well. 3. Other imaging findings of potential clinical significance: Chronic left maxillary sinusitis. Aortic Atherosclerosis (ICD10-I70.0). Emphysema (ICD10-J43.9). Hepatic cirrhosis. Electronically Signed   By: Van Clines M.D.   On: 04/29/2020 17:05   IR IMAGING GUIDED PORT INSERTION  Result Date: 05/02/2020 INDICATION: Recent diagnosis of hepatocellular carcinoma. In need of durable intravenous access for chemotherapy administration. EXAM: IMPLANTED PORT A CATH PLACEMENT WITH ULTRASOUND AND FLUOROSCOPIC GUIDANCE COMPARISON:  None. MEDICATIONS: Ancef 3 gm IV; The antibiotic was administered within an appropriate time interval prior to skin puncture. ANESTHESIA/SEDATION: Moderate (conscious) sedation was employed during this procedure. A total of Versed 1 mg and Fentanyl 50 mcg was administered intravenously. Moderate Sedation Time: 23 minutes. The patient's level of consciousness and vital signs were monitored continuously by radiology nursing throughout the procedure under my direct supervision. CONTRAST:  None FLUOROSCOPY TIME:  18 seconds COMPLICATIONS: None immediate. PROCEDURE: The procedure, risks, benefits, and alternatives were explained to the patient. Questions  regarding the procedure were encouraged and answered. The patient understands and consents to the procedure. The right neck and chest were prepped with chlorhexidine in a sterile fashion, and a sterile drape was applied covering the operative field. Maximum barrier sterile technique with sterile gowns  and gloves were used for the procedure. A timeout was performed prior to the initiation of the procedure. Local anesthesia was provided with 1% lidocaine with epinephrine. After creating a small venotomy incision, a micropuncture kit was utilized to access the internal jugular vein. Real-time ultrasound guidance was utilized for vascular access including the acquisition of a permanent ultrasound image documenting patency of the accessed vessel. The microwire was utilized to measure appropriate catheter length. A subcutaneous port pocket was then created along the upper chest wall utilizing a combination of sharp and blunt dissection. The pocket was irrigated with sterile saline. A single lumen "standard sized" power injectable port was chosen for placement. The 8 Fr catheter was tunneled from the port pocket site to the venotomy incision. The port was placed in the pocket. The external catheter was trimmed to appropriate length. At the venotomy, an 8 Fr peel-away sheath was placed over a guidewire under fluoroscopic guidance. The catheter was then placed through the sheath and the sheath was removed. Final catheter positioning was confirmed and documented with a fluoroscopic spot radiograph. The port was accessed with a Huber needle, aspirated and flushed with heparinized saline. The port pocket incision was closed with interrupted 2-0 Vicryl suture. The skin was opposed with a running subcuticular 4-0 Vicryl suture. Dermabond and Steri-strips were applied to both incisions. Dressings were applied. The patient tolerated the procedure well without immediate post procedural complication. FINDINGS: After catheter  placement, the tip lies within the superior cavoatrial junction. The catheter aspirates and flushes normally and is ready for immediate use. IMPRESSION: Successful placement of a right internal jugular approach power injectable Port-A-Cath. The catheter is ready for immediate use. Electronically Signed   By: Sandi Mariscal M.D.   On: 05/02/2020 11:30    ASSESSMENT: Stage IIIa HCC  PLAN:    1. Stage IIIa HCC: Although patient's AFP is only minimally elevated at 12.1, biopsy confirmed hepatocellular carcinoma.  PET scan results reviewed independently and report as above confirming stage of disease. He wishes to pursue aggressive treatment and plan on using Tecentriq plus Avastin every 3 weeks until intolerable side effects or progression of disease.  Patient is now had port placement.  Will hold Avastin for the first several cycles and refer patient back to interventional radiology for possible Y 90 ablation of his dominant liver mass.  He appears to tolerate cycle 1 failure.  He was evaluated in the emergency room between cycles secondary to dizziness, weakness and a migraine.  He was diagnosed with temporal enteritis and given steroid injection and sent home on prednisone.  He was supposed to follow-up with ophthalmology next day but did not show.  Today's labs show a creatinine of 2.12.  His baseline is normal.  No treatment today.  He will receive 1 L of normal saline and return to clinic in 1 week for further evaluation and labs. 2.  Leukocytosis: Secondary to steroids. 3.  Dehydration: Secondary to underlying malignancy, weight loss and "forgetting" to eat and drink.  Encouraged him to drink plenty of fluid.  We will go ahead and give him some IV fluids today. 4.  Pain: Continue 5 mg oxycodone as needed. 5.  Transaminitis: Secondary to malignancy.  Stable.  I spent a total of 30 minutes reviewing chart data, face-to-face evaluation with the patient, counseling and coordination of care as detailed  above.  Greater than 50% was spent in counseling and coordination of care with this patient including but not limited to discussion of the  relevant topics above (See A&P) including, but not limited to diagnosis and management of acute and chronic medical conditions.    Patient expressed understanding and was in agreement with this plan. He also understands that He can call clinic at any time with any questions, concerns, or complaints.   Cancer Staging Hepatocellular carcinoma [facility]) Staging form: Liver, AJCC 8th Edition - Clinical stage from 05/01/2020: Stage IIIA (cT3, cN0, cM0) - Signed by Lloyd Huger, MD on 05/01/2020   Jacquelin Hawking, NP   05/28/2020 4:04 PM

## 2020-05-29 LAB — T4: T4, Total: 8.8 ug/dL (ref 4.5–12.0)

## 2020-05-30 ENCOUNTER — Encounter (INDEPENDENT_AMBULATORY_CARE_PROVIDER_SITE_OTHER): Payer: Self-pay | Admitting: Nurse Practitioner

## 2020-05-30 ENCOUNTER — Ambulatory Visit (INDEPENDENT_AMBULATORY_CARE_PROVIDER_SITE_OTHER): Payer: Medicare HMO | Admitting: Nurse Practitioner

## 2020-05-30 ENCOUNTER — Other Ambulatory Visit: Payer: Self-pay

## 2020-05-30 VITALS — BP 169/92 | HR 84 | Resp 16 | Ht 73.0 in | Wt 156.2 lb

## 2020-05-30 DIAGNOSIS — R519 Headache, unspecified: Secondary | ICD-10-CM | POA: Diagnosis not present

## 2020-05-30 DIAGNOSIS — I10 Essential (primary) hypertension: Secondary | ICD-10-CM | POA: Diagnosis not present

## 2020-05-31 ENCOUNTER — Encounter (INDEPENDENT_AMBULATORY_CARE_PROVIDER_SITE_OTHER): Payer: Self-pay | Admitting: Nurse Practitioner

## 2020-05-31 NOTE — H&P (View-Only) (Signed)
 Subjective:    Patient ID: Joshua Ramos, male    DOB: 03/26/1951, 69 y.o.   MRN: [id number] Chief Complaint  Patient presents with  . New Patient (Initial Visit)    ref Bradley temporal biopsy    Joshua Ramos is a 69-year-old male that is referred by Dr. King with concern for possible temporal arteritis.  On 05/26/2020 the patient had a severe headache that he described as the frontal area.  He denies any worsening pain on either side of his hand.  During that time an MRI showed small vessel disease there is also elevated ESR of 90 and CRP of 15.5.  Previous eye exam done at [facility] shows optic nerve show no pallor or edema and his eye pressures normal.  In the emergency room he was given a dose of Solu-Medrol as well started on prednisone.  The patient notes that since starting on prednisone at the hospital his headaches have been decreased.  He denies any visual changes.  He denies any fever, chills, nausea, vomiting or diarrhea.  He denies any chest pain or shortness of breath.   Review of Systems  Neurological: Positive for headaches.  All other systems reviewed and are negative.      Objective:   Physical Exam Vitals reviewed.  HENT:     Head: Normocephalic.  Cardiovascular:     Rate and Rhythm: Normal rate.     Pulses: Normal pulses.     Comments: Bilateral temporal pulses Pulmonary:     Effort: Pulmonary effort is normal.  Musculoskeletal:     Cervical back: Normal range of motion.  Skin:    General: Skin is warm and dry.  Neurological:     Mental Status: He is alert and oriented to person, place, and time.  Psychiatric:        Mood and Affect: Mood normal.        Behavior: Behavior normal.        Thought Content: Thought content normal.        Judgment: Judgment normal.     BP (!) 169/92 (BP Location: Right Arm)   Pulse 84   Resp 16   Ht 6' 1" (1.854 m)   Wt 156 lb 3.2 oz (70.9 kg)   BMI 20.61 kg/m   Past Medical History:  Diagnosis  Date  . Cancer (HCC)    liver  . Hepatitis A   . Hypertension     Social History   Socioeconomic History  . Marital status: Single    Spouse name: Not on file  . Number of children: 0  . Years of education: Not on file  . Highest education level: Not on file  Occupational History  . Occupation: retired  Tobacco Use  . Smoking status: Light Tobacco Smoker    Packs/day: 0.50    Types: Cigarettes  . Smokeless tobacco: Never Used  . Tobacco comment: 1 cigarette today  Vaping Use  . Vaping Use: Never used  Substance and Sexual Activity  . Alcohol use: Not Currently  . Drug use: Never  . Sexual activity: Not on file  Other Topics Concern  . Not on file  Social History Narrative   Lives by himself   Social Determinants of Health   Financial Resource Strain:   . Difficulty of Paying Living Expenses: Not on file  Food Insecurity:   . Worried About Running Out of Food in the Last Year: Not on file  . Ran Out of   Food in the Last Year: Not on file  Transportation Needs:   . Lack of Transportation (Medical): Not on file  . Lack of Transportation (Non-Medical): Not on file  Physical Activity:   . Days of Exercise per Week: Not on file  . Minutes of Exercise per Session: Not on file  Stress:   . Feeling of Stress : Not on file  Social Connections:   . Frequency of Communication with Friends and Family: Not on file  . Frequency of Social Gatherings with Friends and Family: Not on file  . Attends Religious Services: Not on file  . Active Member of Clubs or Organizations: Not on file  . Attends Club or Organization Meetings: Not on file  . Marital Status: Not on file  Intimate Partner Violence:   . Fear of Current or Ex-Partner: Not on file  . Emotionally Abused: Not on file  . Physically Abused: Not on file  . Sexually Abused: Not on file    Past Surgical History:  Procedure Laterality Date  . IR IMAGING GUIDED PORT INSERTION  05/02/2020    Family History  Problem  Relation Age of Onset  . Aneurysm Mother   . Diabetes Father     No Known Allergies  CBC Latest Ref Rng & Units 05/28/2020 05/26/2020 05/07/2020  WBC 4.0 - 10.5 K/uL 14.0(H) 7.6 7.9  Hemoglobin 13.0 - 17.0 g/dL 11.5(L) 12.5(L) 13.4  Hematocrit 39 - 52 % 32.8(L) 34.8(L) 37.4(L)  Platelets 150 - 400 K/uL 154 148(L) 177      CMP     Component Value Date/Time   NA 134 (L) 05/28/2020 1013   NA 136 05/08/2014 1620   K 4.5 05/28/2020 1013   K 4.0 05/08/2014 1620   CL 103 05/28/2020 1013   CL 104 05/08/2014 1620   CO2 25 05/28/2020 1013   CO2 25 05/08/2014 1620   GLUCOSE 192 (H) 05/28/2020 1013   GLUCOSE 89 05/08/2014 1620   BUN 59 (H) 05/28/2020 1013   BUN 14 05/08/2014 1620   CREATININE 2.12 (H) 05/28/2020 1013   CREATININE 1.12 05/08/2014 1620   CALCIUM 8.7 (L) 05/28/2020 1013   CALCIUM 8.4 (L) 05/08/2014 1620   PROT 7.1 05/28/2020 1013   PROT 8.2 05/08/2014 1620   ALBUMIN 1.9 (L) 05/28/2020 1013   ALBUMIN 3.2 (L) 05/08/2014 1620   AST 79 (H) 05/28/2020 1013   AST 116 (H) 05/08/2014 1620   ALT 48 (H) 05/28/2020 1013   ALT 137 (H) 05/08/2014 1620   ALKPHOS 189 (H) 05/28/2020 1013   ALKPHOS 91 05/08/2014 1620   BILITOT 1.7 (H) 05/28/2020 1013   BILITOT 1.3 (H) 05/08/2014 1620   GFRNONAA 33 (L) 05/28/2020 1013   GFRNONAA >60 05/08/2014 1620   GFRAA >60 04/04/2020 1007   GFRAA >60 05/08/2014 1620        Assessment & Plan:   1. Severe frontal headaches The patient sudden onset of symptoms as well as lab values are concerning for possible temporal arteritis.  I discussed the surgery for a temporal biopsy with the patient and family.  Risk, benefits and alternatives were discussed.  Patient agrees to proceed.  We will follow up in office following the procedure.  2. Essential (primary) hypertension Continue antihypertensive medications as already ordered, these medications have been reviewed and there are no changes at this time.    Current Outpatient Medications on  File Prior to Visit  Medication Sig Dispense Refill  . buPROPion (WELLBUTRIN SR) 150 MG 12 hr   tablet Take 1 tablet by mouth in the morning and at bedtime.    . lidocaine-prilocaine (EMLA) cream Apply to affected area once 30 g 3  . lisinopril-hydrochlorothiazide (ZESTORETIC) 20-12.5 MG tablet Take 1 tablet by mouth daily.    . naproxen (NAPROSYN) 500 MG tablet Take 500 mg by mouth every 12 (twelve) hours as needed.     . oxyCODONE (OXY IR/ROXICODONE) 5 MG immediate release tablet Take 1 tablet (5 mg total) by mouth every 6 (six) hours as needed for severe pain. 30 tablet 0  . predniSONE (DELTASONE) 10 MG tablet Take 6 tablets (60 mg total) by mouth daily for 5 days. 30 tablet 0  . prochlorperazine (COMPAZINE) 10 MG tablet Take 1 tablet (10 mg total) by mouth every 6 (six) hours as needed (Nausea or vomiting). 60 tablet 2  . capsicum (ZOSTRIX) 0.075 % topical cream Apply 1 application topically every 8 (eight) hours.  (Patient not taking: Reported on 05/28/2020)     No current facility-administered medications on file prior to visit.    There are no Patient Instructions on file for this visit. No follow-ups on file.   Kita Neace E Daziyah Cogan, NP   

## 2020-05-31 NOTE — Progress Notes (Signed)
Subjective:    Patient ID: Joshua Ramos, male    DOB: 05-14-1951, 69 y.o.   MRN: [id number] Chief Complaint  Patient presents with  . New Patient (Initial Visit)    ref Leory Plowman temporal biopsy    Joshua Ramos is a 69 year old male that is referred by Dr. Edison Pace with concern for possible temporal arteritis.  On 05/26/2020 the patient had a severe headache that he described as the frontal area.  He denies any worsening pain on either side of his hand.  During that time an MRI showed small vessel disease there is also elevated ESR of 90 and CRP of 15.5.  Previous eye exam done at [facility] shows optic nerve show no pallor or edema and his eye pressures normal.  In the emergency room he was given a dose of Solu-Medrol as well started on prednisone.  The patient notes that since starting on prednisone at the hospital his headaches have been decreased.  He denies any visual changes.  He denies any fever, chills, nausea, vomiting or diarrhea.  He denies any chest pain or shortness of breath.   Review of Systems  Neurological: Positive for headaches.  All other systems reviewed and are negative.      Objective:   Physical Exam Vitals reviewed.  HENT:     Head: Normocephalic.  Cardiovascular:     Rate and Rhythm: Normal rate.     Pulses: Normal pulses.     Comments: Bilateral temporal pulses Pulmonary:     Effort: Pulmonary effort is normal.  Musculoskeletal:     Cervical back: Normal range of motion.  Skin:    General: Skin is warm and dry.  Neurological:     Mental Status: He is alert and oriented to person, place, and time.  Psychiatric:        Mood and Affect: Mood normal.        Behavior: Behavior normal.        Thought Content: Thought content normal.        Judgment: Judgment normal.     BP (!) 169/92 (BP Location: Right Arm)   Pulse 84   Resp 16   Ht _0  (1.854 m)   Wt 156 lb 3.2 oz (70.9 kg)   BMI 20.61 kg/m   Past Medical History:  Diagnosis  Date  . Cancer ([place])    liver  . Hepatitis A   . Hypertension     Social History   Socioeconomic History  . Marital status: Single    Spouse name: Not on file  . Number of children: 0  . Years of education: Not on file  . Highest education level: Not on file  Occupational History  . Occupation: retired  Tobacco Use  . Smoking status: Light Tobacco Smoker    Packs/day: 0.50    Types: Cigarettes  . Smokeless tobacco: Never Used  . Tobacco comment: 1 cigarette today  Vaping Use  . Vaping Use: Never used  Substance and Sexual Activity  . Alcohol use: Not Currently  . Drug use: Never  . Sexual activity: Not on file  Other Topics Concern  . Not on file  Social History Narrative   Lives by himself   Social Determinants of Health   Financial Resource Strain:   . Difficulty of Paying Living Expenses: Not on file  Food Insecurity:   . Worried About Charity fundraiser in the Last Year: Not on file  . Ran Out of  Food in the Last Year: Not on file  Transportation Needs:   . Lack of Transportation (Medical): Not on file  . Lack of Transportation (Non-Medical): Not on file  Physical Activity:   . Days of Exercise per Week: Not on file  . Minutes of Exercise per Session: Not on file  Stress:   . Feeling of Stress : Not on file  Social Connections:   . Frequency of Communication with Friends and Family: Not on file  . Frequency of Social Gatherings with Friends and Family: Not on file  . Attends Religious Services: Not on file  . Active Member of Clubs or Organizations: Not on file  . Attends Archivist Meetings: Not on file  . Marital Status: Not on file  Intimate Partner Violence:   . Fear of Current or Ex-Partner: Not on file  . Emotionally Abused: Not on file  . Physically Abused: Not on file  . Sexually Abused: Not on file    Past Surgical History:  Procedure Laterality Date  . IR IMAGING GUIDED PORT INSERTION  05/02/2020    Family History  Problem  Relation Age of Onset  . Aneurysm Mother   . Diabetes Father     No Known Allergies  CBC Latest Ref Rng & Units 05/28/2020 05/26/2020 05/07/2020  WBC 4.0 - 10.5 K/uL 14.0(H) 7.6 7.9  Hemoglobin 13.0 - 17.0 g/dL 11.5(L) 12.5(L) 13.4  Hematocrit 39 - 52 % 32.8(L) 34.8(L) 37.4(L)  Platelets 150 - 400 K/uL 154 148(L) 177      CMP     Component Value Date/Time   NA 134 (L) 05/28/2020 1013   NA 136 05/08/2014 1620   K 4.5 05/28/2020 1013   K 4.0 05/08/2014 1620   CL 103 05/28/2020 1013   CL 104 05/08/2014 1620   CO2 25 05/28/2020 1013   CO2 25 05/08/2014 1620   GLUCOSE 192 (H) 05/28/2020 1013   GLUCOSE 89 05/08/2014 1620   BUN 59 (H) 05/28/2020 1013   BUN 14 05/08/2014 1620   CREATININE 2.12 (H) 05/28/2020 1013   CREATININE 1.12 05/08/2014 1620   CALCIUM 8.7 (L) 05/28/2020 1013   CALCIUM 8.4 (L) 05/08/2014 1620   PROT 7.1 05/28/2020 1013   PROT 8.2 05/08/2014 1620   ALBUMIN 1.9 (L) 05/28/2020 1013   ALBUMIN 3.2 (L) 05/08/2014 1620   AST 79 (H) 05/28/2020 1013   AST 116 (H) 05/08/2014 1620   ALT 48 (H) 05/28/2020 1013   ALT 137 (H) 05/08/2014 1620   ALKPHOS 189 (H) 05/28/2020 1013   ALKPHOS 91 05/08/2014 1620   BILITOT 1.7 (H) 05/28/2020 1013   BILITOT 1.3 (H) 05/08/2014 1620   GFRNONAA 33 (L) 05/28/2020 1013   GFRNONAA >60 05/08/2014 1620   GFRAA >60 04/04/2020 1007   GFRAA >60 05/08/2014 1620        Assessment & Plan:   1. Severe frontal headaches The patient sudden onset of symptoms as well as lab values are concerning for possible temporal arteritis.  I discussed the surgery for a temporal biopsy with the patient and family.  Risk, benefits and alternatives were discussed.  Patient agrees to proceed.  We will follow up in office following the procedure.  2. Essential (primary) hypertension Continue antihypertensive medications as already ordered, these medications have been reviewed and there are no changes at this time.    Current Outpatient Medications on  File Prior to Visit  Medication Sig Dispense Refill  . buPROPion (WELLBUTRIN SR) 150 MG 12 hr  tablet Take 1 tablet by mouth in the morning and at bedtime.    . lidocaine-prilocaine (EMLA) cream Apply to affected area once 30 g 3  . lisinopril-hydrochlorothiazide (ZESTORETIC) 20-12.5 MG tablet Take 1 tablet by mouth daily.    . naproxen (NAPROSYN) 500 MG tablet Take 500 mg by mouth every 12 (twelve) hours as needed.     Marland Kitchen oxyCODONE (OXY IR/ROXICODONE) 5 MG immediate release tablet Take 1 tablet (5 mg total) by mouth every 6 (six) hours as needed for severe pain. 30 tablet 0  . predniSONE (DELTASONE) 10 MG tablet Take 6 tablets (60 mg total) by mouth daily for 5 days. 30 tablet 0  . prochlorperazine (COMPAZINE) 10 MG tablet Take 1 tablet (10 mg total) by mouth every 6 (six) hours as needed (Nausea or vomiting). 60 tablet 2  . capsicum (ZOSTRIX) 0.075 % topical cream Apply 1 application topically every 8 (eight) hours.  (Patient not taking: Reported on 05/28/2020)     No current facility-administered medications on file prior to visit.    There are no Patient Instructions on file for this visit. No follow-ups on file.   Kris Hartmann, NP

## 2020-06-01 NOTE — Progress Notes (Signed)
  [facility]  Telephone:([phone]) [phone] Fax:(336) [phone]  ID: Joshua Ramos OB: Oct 11, 1950  MR#: [id number]  OIT#:[id number]  Patient Care Team: Romualdo Bolk, FNP as PCP - General (Nurse Practitioner) Clent Jacks, RN as Oncology Nurse Navigator    Lloyd Huger, MD   06/05/2020 7:23 AM     This encounter was created in error - please disregard.

## 2020-06-02 ENCOUNTER — Other Ambulatory Visit (INDEPENDENT_AMBULATORY_CARE_PROVIDER_SITE_OTHER): Payer: Self-pay | Admitting: Nurse Practitioner

## 2020-06-02 ENCOUNTER — Telehealth (INDEPENDENT_AMBULATORY_CARE_PROVIDER_SITE_OTHER): Payer: Self-pay

## 2020-06-02 NOTE — Telephone Encounter (Signed)
Spoke with the patient's sister and the patient is scheduled with Dr. Lucky Cowboy for a Temporal artery biopsy on [facility] at [facility]. Pre-op is a phone call on 06/03/20 between 8-1 pm and covid testing on 06/03/20 between 8-1 pm at the [facility]. Pre-surgical instructions were discussed and per the sister she was writing the information down.

## 2020-06-03 ENCOUNTER — Other Ambulatory Visit: Payer: Self-pay

## 2020-06-03 ENCOUNTER — Encounter
Admission: RE | Admit: 2020-06-03 | Discharge: 2020-06-03 | Disposition: A | Discharge status: Home / Self Care | Payer: Medicare HMO | Source: Ambulatory Visit | Attending: Vascular Surgery | Admitting: Vascular Surgery

## 2020-06-03 ENCOUNTER — Other Ambulatory Visit
Admission: RE | Admit: 2020-06-03 | Discharge: 2020-06-03 | Disposition: A | Discharge status: Home / Self Care | Payer: Medicare HMO | Source: Ambulatory Visit | Attending: Vascular Surgery | Admitting: Vascular Surgery

## 2020-06-03 DIAGNOSIS — Z20822 Contact with and (suspected) exposure to covid-19: Secondary | ICD-10-CM | POA: Insufficient documentation

## 2020-06-03 DIAGNOSIS — Z01812 Encounter for preprocedural laboratory examination: Secondary | ICD-10-CM | POA: Insufficient documentation

## 2020-06-03 HISTORY — DX: Headache, unspecified: R51.9

## 2020-06-03 LAB — SARS CORONAVIRUS 2 (TAT 6-24 HRS): SARS Coronavirus 2: NEGATIVE

## 2020-06-03 NOTE — Patient Instructions (Signed)
Your procedure is scheduled on:06-04-20 WEDNESDAY Report to the Registration Desk on the 1st floor of the [facility].@ 2 PM  REMEMBER: Instructions that are not followed completely may result in serious medical risk, up to and including death; or upon the discretion of your surgeon and anesthesiologist your surgery may need to be rescheduled.  Do not eat food after midnight the night before surgery.  No gum chewing, lozengers or hard candies.  You may however, drink CLEAR liquids up to 2 hours before you are scheduled to arrive for your surgery. Do not drink anything within 2 hours of your scheduled arrival time.  Clear liquids include: - water  - apple juice without pulp - gatorade (not RED, PURPLE, OR BLUE) - black coffee or tea (Do NOT add milk or creamers to the coffee or tea) Do NOT drink anything that is not on this list.  TAKE THESE MEDICATIONS THE MORNING OF SURGERY WITH A SIP OF WATER: -BUPROPION (WELLBUTRIN) -YOU MAY TAKE OXYCODONE IF NEEDED FOR PAIN    One week prior to surgery: Stop Anti-inflammatories (NSAIDS) such as Advil, Aleve, Ibuprofen, Motrin, Naproxen, Naprosyn and Aspirin based products such as Excedrin, Goodys Powder, BC Powder.  Stop ANY OVER THE COUNTER supplements until after surgery.  No Alcohol for 24 hours before or after surgery.  No Smoking including e-cigarettes for 24 hours prior to surgery.  No chewable tobacco products for at least 6 hours prior to surgery.  No nicotine patches on the day of surgery.  Do not use any "recreational" drugs for at least a week prior to your surgery.  Please be advised that the combination of cocaine and anesthesia may have negative outcomes, up to and including death. If you test positive for cocaine, your surgery will be cancelled.  On the morning of surgery brush your teeth with toothpaste and water, you may rinse your mouth with mouthwash if you wish. Do not swallow any toothpaste or mouthwash.  Do not  wear jewelry, make-up, hairpins, clips or nail polish.  Do not wear lotions, powders, or perfumes.   Do not shave 48 hours prior to surgery.   Contact lenses, hearing aids and dentures may not be worn into surgery.  Do not bring valuables to the hospital. [facility] is not responsible for any missing/lost belongings or valuables.    Notify your doctor if there is any change in your medical condition (cold, fever, infection).  Wear comfortable clothing (specific to your surgery type) to the hospital.  Plan for stool softeners for home use; pain medications have a tendency to cause constipation. You can also help prevent constipation by eating foods high in fiber such as fruits and vegetables and drinking plenty of fluids as your diet allows.  After surgery, you can help prevent lung complications by doing breathing exercises.  Take deep breaths and cough every 1-2 hours. Your doctor may order a device called an Incentive Spirometer to help you take deep breaths. When coughing or sneezing, hold a pillow firmly against your incision with both hands. This is called "splinting." Doing this helps protect your incision. It also decreases belly discomfort.  If you are being admitted to the hospital overnight, leave your suitcase in the car. After surgery it may be brought to your room.  If you are being discharged the day of surgery, you will not be allowed to drive home. You will need a responsible adult (18 years or older) to drive you home and stay with you that night.  If you are taking public transportation, you will need to have a responsible adult (18 years or older) with you. Please confirm with your physician that it is acceptable to use public transportation.   Please call the [facility] Dept. at [phone] if you have any questions about these instructions.  Visitation Policy:  Patients undergoing a surgery or procedure may have one family member or support  person with them as long as that person is not COVID-19 positive or experiencing its symptoms.  That person may remain in the waiting area during the procedure.  Inpatient Visitation Update:   In an effort to ensure the safety of our team members and our patients, we are implementing a change to our visitation policy:  Effective Monday, Aug. 9, at 7 a.m., inpatients will be allowed one support person.  o The support person may change daily.  o The support person must pass our screening, gel in and out, and wear a mask at all times, including in the patient's room.  o Patients must also wear a mask when staff or their support person are in the room.  o Masking is required regardless of vaccination status.  Systemwide, no visitors 17 or younger.

## 2020-06-03 NOTE — Pre-Procedure Instructions (Signed)
CALLED MULTIPLE TIMES TODAY TO DO PHONE INTERVIEW WITH PT FOR SURGERY TOMORROW-PT NEVER ANSWERED OR CALLED BACK AFTER MESSAGES LEFT-FINALLY GOT IN TOUCH WITH Joshua Ramos WHO IS HIS SISTER-SHE STATES SHE IS HIS HC POA BUT HAS NEVER DONE ANY PAPER WORK TO MAKE IT LEGAL. Joshua Ramos ANSWERED MY QUESTIONS AND WILL BRING ALL PTS MEDS IN AM FOR HIS SURGERY TO MAKE SURE ALL MEDS ARE CORRECT IN Epic. SURGERY INSTRUCTIONS REVIEWED WITH Joshua Ramos AND SHE VERBALIZED UNDERSTANDING

## 2020-06-04 ENCOUNTER — Other Ambulatory Visit: Payer: Medicare HMO

## 2020-06-04 ENCOUNTER — Encounter: Payer: Self-pay | Admitting: Certified Registered Nurse Anesthetist

## 2020-06-04 ENCOUNTER — Inpatient Hospital Stay: Payer: Medicare HMO

## 2020-06-04 ENCOUNTER — Other Ambulatory Visit: Payer: Self-pay

## 2020-06-04 ENCOUNTER — Ambulatory Visit
Admission: RE | Admit: 2020-06-04 | Discharge: 2020-06-04 | Disposition: A | Discharge status: Home / Self Care | Payer: Medicare HMO | Attending: Vascular Surgery | Admitting: Vascular Surgery

## 2020-06-04 ENCOUNTER — Inpatient Hospital Stay: Payer: Medicare HMO | Admitting: Oncology

## 2020-06-04 ENCOUNTER — Encounter
Admission: RE | Disposition: A | Discharge status: Home / Self Care | Payer: Self-pay | Source: Home / Self Care | Attending: Vascular Surgery

## 2020-06-04 DIAGNOSIS — C220 Liver cell carcinoma: Secondary | ICD-10-CM

## 2020-06-04 DIAGNOSIS — Z538 Procedure and treatment not carried out for other reasons: Secondary | ICD-10-CM | POA: Insufficient documentation

## 2020-06-04 DIAGNOSIS — R519 Headache, unspecified: Secondary | ICD-10-CM | POA: Diagnosis present

## 2020-06-04 SURGERY — BIOPSY TEMPORAL ARTERY
Anesthesia: General | Laterality: Right

## 2020-06-04 MED ORDER — ORAL CARE MOUTH RINSE: 15.0000 mL | Freq: Once | OROMUCOSAL | Status: DC

## 2020-06-04 MED ORDER — FAMOTIDINE 20 MG PO TABS: 20.0000 mg | ORAL_TABLET | Freq: Once | ORAL | Status: DC

## 2020-06-04 MED ORDER — CHLORHEXIDINE GLUCONATE CLOTH 2 % EX PADS: 6.0000 | MEDICATED_PAD | Freq: Once | CUTANEOUS | Status: DC

## 2020-06-04 MED ORDER — LACTATED RINGERS IV SOLN: INTRAVENOUS | Status: DC

## 2020-06-04 MED ORDER — CHLORHEXIDINE GLUCONATE 0.12 % MT SOLN: 15.0000 mL | Freq: Once | OROMUCOSAL | Status: DC

## 2020-06-04 MED ORDER — CEFAZOLIN SODIUM-DEXTROSE 2-4 GM/100ML-% IV SOLN: 2.0000 g | INTRAVENOUS | Status: DC

## 2020-06-04 MED ORDER — FAMOTIDINE 20 MG PO TABS
ORAL_TABLET | ORAL | Status: AC
  Filled 2020-06-04: qty 1

## 2020-06-04 MED ORDER — CHLORHEXIDINE GLUCONATE 0.12 % MT SOLN
OROMUCOSAL | Status: AC
  Filled 2020-06-04: qty 15

## 2020-06-04 MED ORDER — CEFAZOLIN SODIUM-DEXTROSE 2-4 GM/100ML-% IV SOLN
INTRAVENOUS | Status: AC
  Filled 2020-06-04: qty 100

## 2020-06-04 SURGICAL SUPPLY — 41 items
BLADE SURG 15 STRL LF DISP TIS (BLADE) ×1 IMPLANT
BLADE SURG 15 STRL SS (BLADE) ×1
BLADE SURG SZ11 CARB STEEL (BLADE) ×2 IMPLANT
CNTNR SPEC 2.5X3XGRAD LEK (MISCELLANEOUS)
CONT SPEC 4OZ STER OR WHT (MISCELLANEOUS)
CONTAINER SPEC 2.5X3XGRAD LEK (MISCELLANEOUS) IMPLANT
COTTON BALL STRL MEDIUM (GAUZE/BANDAGES/DRESSINGS) ×2 IMPLANT
COVER WAND RF STERILE (DRAPES) ×2 IMPLANT
DERMABOND ADVANCED (GAUZE/BANDAGES/DRESSINGS) ×1
DERMABOND ADVANCED .7 DNX12 (GAUZE/BANDAGES/DRESSINGS) ×1 IMPLANT
DRAPE LAPAROTOMY 77X122 PED (DRAPES) ×2 IMPLANT
DRSG TELFA 4X3 1S NADH ST (GAUZE/BANDAGES/DRESSINGS) ×2 IMPLANT
ELECT CAUTERY BLADE 6.4 (BLADE) ×2 IMPLANT
ELECT REM PT RETURN 9FT ADLT (ELECTROSURGICAL) ×2
ELECTRODE REM PT RTRN 9FT ADLT (ELECTROSURGICAL) ×1 IMPLANT
GLOVE BIO SURGEON STRL SZ7 (GLOVE) ×2 IMPLANT
GOWN STRL REUS W/ TWL LRG LVL3 (GOWN DISPOSABLE) ×2 IMPLANT
GOWN STRL REUS W/ TWL XL LVL3 (GOWN DISPOSABLE) ×1 IMPLANT
GOWN STRL REUS W/TWL LRG LVL3 (GOWN DISPOSABLE) ×2
GOWN STRL REUS W/TWL XL LVL3 (GOWN DISPOSABLE) ×1
KIT TURNOVER KIT A (KITS) ×2 IMPLANT
LABEL OR SOLS (LABEL) ×2 IMPLANT
MANIFOLD NEPTUNE II (INSTRUMENTS) ×2 IMPLANT
NEEDLE HYPO 25X1 1.5 SAFETY (NEEDLE) IMPLANT
NS IRRIG 500ML POUR BTL (IV SOLUTION) ×2 IMPLANT
PACK BASIN MINOR (MISCELLANEOUS) ×2 IMPLANT
SOL PREP PVP 2OZ (MISCELLANEOUS) ×2
SOLUTION PREP PVP 2OZ (MISCELLANEOUS) ×1 IMPLANT
SUCTION FRAZIER HANDLE 10FR (MISCELLANEOUS) ×1
SUCTION TUBE FRAZIER 10FR DISP (MISCELLANEOUS) ×1 IMPLANT
SUT MNCRL AB 4-0 PS2 18 (SUTURE) ×2 IMPLANT
SUT SILK 2 0 (SUTURE) ×1
SUT SILK 2-0 18XBRD TIE 12 (SUTURE) ×1 IMPLANT
SUT SILK 3 0 (SUTURE) ×1
SUT SILK 3-0 18XBRD TIE 12 (SUTURE) ×1 IMPLANT
SUT SILK 4 0 (SUTURE) ×1
SUT SILK 4-0 18XBRD TIE 12 (SUTURE) ×1 IMPLANT
SUT VIC AB 3-0 SH 27 (SUTURE) ×1
SUT VIC AB 3-0 SH 27X BRD (SUTURE) ×1 IMPLANT
SYR 10ML LL (SYRINGE) IMPLANT
SYR BULB IRRIG 60ML STRL (SYRINGE) ×2 IMPLANT

## 2020-06-04 NOTE — OR Nursing (Signed)
K Stegmyer PA-C in to see patient and explained Dr Lucky Cowboy is canceling surgery today r/t scheduling conflicts with another case.  Dr Bunnie Domino office will reschedule patient per K Stegmyer PA-C, explained to family as well. Patient verbalized understanding.

## 2020-06-05 ENCOUNTER — Ambulatory Visit: Admit: 2020-06-05 | Payer: Medicare HMO | Admitting: Vascular Surgery

## 2020-06-05 NOTE — Telephone Encounter (Signed)
Spoke with the patient's sister Thayer Headings and he has been rescheduled with Dr. Lucky Cowboy for a right temporal artery biopsy on 06/13/20 and covid testing on 06/11/20 between 8-1 pm at the [facility]. Pre-surgical instructions were discussed and will be mailed.

## 2020-06-06 ENCOUNTER — Other Ambulatory Visit: Payer: Self-pay | Admitting: *Deleted

## 2020-06-06 MED ORDER — OXYCODONE HCL 5 MG PO TABS: 5.0000 mg | ORAL_TABLET | Freq: Four times a day (QID) | ORAL | 0 refills | Status: DC | PRN

## 2020-06-07 NOTE — Progress Notes (Signed)
[place]  [phone]) [phone] [phone]) [phone]  ID: Joshua Ramos OB: 1951/05/04  MR#: [id number]  ASN#:[id number]  Patient Care Team: Romualdo Bolk, FNP as PCP - General (Nurse Practitioner) Clent Jacks, RN as Oncology Nurse Navigator  CHIEF COMPLAINT: Stage IIIa [facility]  INTERVAL HISTORY: Patient returns to clinic today for further evaluation and consideration of cycle two of Tecentriq. Treatment was delayed secondary to patient illness as well as biopsy for temporal arteritis. He has chronic weakness and fatigue, but otherwise feels well. He does not complain of pain today.  Avastin is on hold secondary to possible Y 90 ablation in the future. His appetite has improved and his weight is stable. He continues to have right leg weakness.  He has no other neurologic complaints.  He denies any recent fevers or illnesses.  He has no chest pain, shortness of breath, cough, or hemoptysis. He denies any abdominal pain. He denies any nausea, vomiting, constipation, or diarrhea.  He has occasional urinary retention. Patient offers no further specific complaints today.  REVIEW OF SYSTEMS:   Review of Systems  Constitutional: Positive for malaise/fatigue and weight loss. Negative for fever.  Respiratory: Negative.  Negative for cough and shortness of breath.   Cardiovascular: Negative.  Negative for chest pain and leg swelling.  Gastrointestinal: Negative.  Negative for abdominal pain, blood in stool, constipation, nausea and vomiting.  Genitourinary: Negative.  Negative for flank pain.  Musculoskeletal: Negative.  Negative for back pain.  Skin: Negative.  Negative for rash.  Neurological: Positive for focal weakness and weakness. Negative for dizziness and headaches.  Psychiatric/Behavioral: Negative.  The patient is not nervous/anxious.     As per HPI. Otherwise, a complete review of systems is negative.  PAST MEDICAL HISTORY: Past Medical History:   Diagnosis Date  . Cancer (HCC)    liver/LUNG  . Headache    almost completely unbearable.  only time improves this  . Hepatitis A 04/2020   C+  . Hypertension   . Stroke [facility]) 2010   right sided weakness remains    PAST SURGICAL HISTORY: Past Surgical History:  Procedure Laterality Date  . ARTERY BIOPSY Right 06/13/2020   Procedure: BIOPSY TEMPORAL ARTERY;  Surgeon: Algernon Huxley, MD;  Location: [facility] ORS;  Service: Vascular;  Laterality: Right;  . IR IMAGING GUIDED PORT INSERTION  05/02/2020    FAMILY HISTORY: Family History  Problem Relation Age of Onset  . Aneurysm Mother   . Diabetes Father     ADVANCED DIRECTIVES (Y/N):  N  HEALTH MAINTENANCE: Social History   Tobacco Use  . Smoking status: Former Smoker    Packs/day: 0.50    Years: 40.00    Pack years: 20.00    Types: Cigarettes  . Smokeless tobacco: Never Used  . Tobacco comment: has not smoked in one month  Vaping Use  . Vaping Use: Never used  Substance Use Topics  . Alcohol use: Not Currently  . Drug use: Not Currently    Comment: PTS SISTER [place] HE HAS USED DRUGS IN THE PAST-UNSURE WHICH DRUGS WERE USED AND HOW LONG AGO     Colonoscopy:  PAP:  Bone density:  Lipid panel:  Not on File  Current Outpatient Medications  Medication Sig Dispense Refill  . buPROPion (WELLBUTRIN SR) 150 MG 12 hr tablet Take 1 tablet by mouth in the morning and at bedtime.    . capsicum (ZOSTRIX) 0.075 % topical cream Apply 1 application topically every 8 (eight)  hours.     . lidocaine-prilocaine (EMLA) cream Apply to affected area once 30 g 3  . lisinopril-hydrochlorothiazide (ZESTORETIC) 20-12.5 MG tablet Take 1 tablet by mouth daily.    . naproxen (NAPROSYN) 500 MG tablet Take 500 mg by mouth every 12 (twelve) hours as needed.     Marland Kitchen oxyCODONE (OXY IR/ROXICODONE) 5 MG immediate release tablet Take 1 tablet (5 mg total) by mouth every 6 (six) hours as needed for severe pain. 30 tablet 0  . prochlorperazine  (COMPAZINE) 10 MG tablet Take 1 tablet (10 mg total) by mouth every 6 (six) hours as needed (Nausea or vomiting). 60 tablet 2   No current facility-administered medications for this visit.    OBJECTIVE: Vitals:   06/11/20 0901  BP: 129/77  Pulse: 76  Resp: 16  Temp: (!) 97 F (36.1 C)  SpO2: 100%     Body mass index is 18.22 kg/m.    ECOG FS:1 - Symptomatic but completely ambulatory  General: Well-developed, well-nourished, no acute distress. Eyes: Pink conjunctiva, anicteric sclera. HEENT: Normocephalic, moist mucous membranes. Lungs: No audible wheezing or coughing. Heart: Regular rate and rhythm. Abdomen: Soft, nontender, no obvious distention. Musculoskeletal: No edema, cyanosis, or clubbing. Neuro: Alert, answering all questions appropriately. Cranial nerves grossly intact. Skin: No rashes or petechiae noted. Psych: Normal affect.   LAB RESULTS:  Lab Results  Component Value Date   NA 136 06/11/2020   K 3.8 06/11/2020   CL 101 06/11/2020   CO2 25 06/11/2020   GLUCOSE 90 06/11/2020   BUN 23 06/11/2020   CREATININE 1.58 (H) 06/11/2020   CALCIUM 8.8 (L) 06/11/2020   PROT 6.9 06/11/2020   ALBUMIN 2.3 (L) 06/11/2020   AST 73 (H) 06/11/2020   ALT 60 (H) 06/11/2020   ALKPHOS 169 (H) 06/11/2020   BILITOT 1.6 (H) 06/11/2020   GFRNONAA 47 (L) 06/11/2020   GFRAA >60 04/04/2020    Lab Results  Component Value Date   WBC 5.5 06/11/2020   NEUTROABS 3.1 06/11/2020   HGB 12.8 (L) 06/11/2020   HCT 36.2 (L) 06/11/2020   MCV 95.5 06/11/2020   PLT 170 06/11/2020     STUDIES: CT Angio Head W or Wo Contrast  Result Date: 05/26/2020 CLINICAL DATA:  Dizziness.  Headache. EXAM: CT ANGIOGRAPHY HEAD AND NECK TECHNIQUE: Multidetector CT imaging of the head and neck was performed using the standard protocol during bolus administration of intravenous contrast. Multiplanar CT image reconstructions and MIPs were obtained to evaluate the vascular anatomy. Carotid stenosis  measurements (when applicable) are obtained utilizing NASCET criteria, using the distal internal carotid diameter as the denominator. CONTRAST:  58mL OMNIPAQUE IOHEXOL 350 MG/ML SOLN COMPARISON:  None. FINDINGS: CT HEAD FINDINGS Brain: There is no evidence of an acute infarct, intracranial hemorrhage, mass, midline shift, or extra-axial fluid collection. The ventricles and sulci are normal. Scattered hypodensities in the cerebral white matter bilaterally are nonspecific but compatible with mild chronic small vessel ischemic disease. Vascular: Calcified atherosclerosis at the skull base. No hyperdense vessel. Skull: No fracture or suspicious osseous lesion. Sinuses: Focal mucosal thickening superiorly in the left maxillary sinus. Clear mastoid air cells. Orbits: Unremarkable. Review of the MIP images confirms the above findings CTA NECK FINDINGS Aortic arch: Standard 3 vessel aortic arch with mild atherosclerotic plaque. Widely patent arch vessel origins. Right carotid system: Patent without evidence of stenosis, dissection, or significant atherosclerosis. Left carotid system: Patent without evidence of stenosis, dissection, or significant atherosclerosis. Vertebral arteries: Patent and codominant without evidence of  stenosis, dissection, or significant atherosclerosis. Skeleton: Edentulous. Advanced multilevel cervical facet arthrosis. Moderate disc degeneration at C6-7. Other neck: No definite evidence of cervical lymphadenopathy or mass although assessment is limited by paucity of fat and contrast timing which was not tailored for evaluation of the soft tissues. Upper chest: Moderate centrilobular and paraseptal emphysema. Partially visualized right jugular Port-A-Cath. Review of the MIP images confirms the above findings CTA HEAD FINDINGS Anterior circulation: The internal carotid arteries are patent from skull base to carotid termini with atherosclerosis resulting in mild right greater than left cavernous  segment stenoses. ACAs and MCAs are patent without evidence of a significant A1 or M1 stenosis or proximal branch occlusion. Diffusely irregular appearance of the branch vessels on reformats may reflect a combination of artifact from image noise and atherosclerosis. No aneurysm is identified. Posterior circulation: The intracranial vertebral arteries are widely patent to the basilar. Patent left PICA, right AICA, and bilateral SCA origins are visualized. The basilar artery is widely patent. There is a small left posterior communicating artery. Both PCAs are patent without evidence of a significant proximal stenosis on the right. There is a moderate proximal left P2 stenosis. No aneurysm is identified. Venous sinuses: Patent. Anatomic variants: None. Review of the MIP images confirms the above findings IMPRESSION: 1. No evidence of acute intracranial abnormality. 2. Mild chronic small vessel ischemic disease. 3. Intracranial atherosclerosis with mild bilateral ICA and moderate left P2 stenoses. 4. Widely patent cervical carotid and vertebral arteries. 5. Aortic Atherosclerosis (ICD10-I70.0) and Emphysema (ICD10-J43.9). Electronically Signed   By: Logan Bores M.D.   On: 05/26/2020 20:22   DG Chest 2 View  Result Date: 05/26/2020 CLINICAL DATA:  Dizziness.  History of hepatocellular carcinoma. EXAM: CHEST - 2 VIEW COMPARISON:  Included lung apices from neck CTA earlier today. PET CT 04/29/2020 FINDINGS: Right chest port in place. Emphysema and bronchial thickening. Heart is normal in size with normal mediastinal contours. Aortic atherosclerosis. No focal airspace disease, pleural effusion, or pneumothorax. No acute osseous abnormalities are seen. IMPRESSION: Emphysema and bronchial thickening.  No superimposed acute findings. Electronically Signed   By: Keith Rake M.D.   On: 05/26/2020 20:42   CT Angio Neck W and/or Wo Contrast  Result Date: 05/26/2020 CLINICAL DATA:  Dizziness.  Headache. EXAM: CT  ANGIOGRAPHY HEAD AND NECK TECHNIQUE: Multidetector CT imaging of the head and neck was performed using the standard protocol during bolus administration of intravenous contrast. Multiplanar CT image reconstructions and MIPs were obtained to evaluate the vascular anatomy. Carotid stenosis measurements (when applicable) are obtained utilizing NASCET criteria, using the distal internal carotid diameter as the denominator. CONTRAST:  41mL OMNIPAQUE IOHEXOL 350 MG/ML SOLN COMPARISON:  None. FINDINGS: CT HEAD FINDINGS Brain: There is no evidence of an acute infarct, intracranial hemorrhage, mass, midline shift, or extra-axial fluid collection. The ventricles and sulci are normal. Scattered hypodensities in the cerebral white matter bilaterally are nonspecific but compatible with mild chronic small vessel ischemic disease. Vascular: Calcified atherosclerosis at the skull base. No hyperdense vessel. Skull: No fracture or suspicious osseous lesion. Sinuses: Focal mucosal thickening superiorly in the left maxillary sinus. Clear mastoid air cells. Orbits: Unremarkable. Review of the MIP images confirms the above findings CTA NECK FINDINGS Aortic arch: Standard 3 vessel aortic arch with mild atherosclerotic plaque. Widely patent arch vessel origins. Right carotid system: Patent without evidence of stenosis, dissection, or significant atherosclerosis. Left carotid system: Patent without evidence of stenosis, dissection, or significant atherosclerosis. Vertebral arteries: Patent and codominant without evidence  of stenosis, dissection, or significant atherosclerosis. Skeleton: Edentulous. Advanced multilevel cervical facet arthrosis. Moderate disc degeneration at C6-7. Other neck: No definite evidence of cervical lymphadenopathy or mass although assessment is limited by paucity of fat and contrast timing which was not tailored for evaluation of the soft tissues. Upper chest: Moderate centrilobular and paraseptal emphysema.  Partially visualized right jugular Port-A-Cath. Review of the MIP images confirms the above findings CTA HEAD FINDINGS Anterior circulation: The internal carotid arteries are patent from skull base to carotid termini with atherosclerosis resulting in mild right greater than left cavernous segment stenoses. ACAs and MCAs are patent without evidence of a significant A1 or M1 stenosis or proximal branch occlusion. Diffusely irregular appearance of the branch vessels on reformats may reflect a combination of artifact from image noise and atherosclerosis. No aneurysm is identified. Posterior circulation: The intracranial vertebral arteries are widely patent to the basilar. Patent left PICA, right AICA, and bilateral SCA origins are visualized. The basilar artery is widely patent. There is a small left posterior communicating artery. Both PCAs are patent without evidence of a significant proximal stenosis on the right. There is a moderate proximal left P2 stenosis. No aneurysm is identified. Venous sinuses: Patent. Anatomic variants: None. Review of the MIP images confirms the above findings IMPRESSION: 1. No evidence of acute intracranial abnormality. 2. Mild chronic small vessel ischemic disease. 3. Intracranial atherosclerosis with mild bilateral ICA and moderate left P2 stenoses. 4. Widely patent cervical carotid and vertebral arteries. 5. Aortic Atherosclerosis (ICD10-I70.0) and Emphysema (ICD10-J43.9). Electronically Signed   By: Logan Bores M.D.   On: 05/26/2020 20:22   MR BRAIN WO CONTRAST  Result Date: 05/26/2020 CLINICAL DATA:  Initial evaluation for neuro deficit, dizziness with severe headache. History of hepatocellular carcinoma. Six EXAM: MRI HEAD WITHOUT CONTRAST TECHNIQUE: Multiplanar, multiecho pulse sequences of the brain and surrounding structures were obtained without intravenous contrast. COMPARISON:  Prior CT from earlier the same day. FINDINGS: Brain: Examination mildly degraded by motion.  Generalized age-related cerebral atrophy. Patchy and confluent T2/FLAIR hyperintensity within the periventricular, deep, and subcortical white matter both cerebral hemispheres, most like related chronic microvascular ischemic disease, moderate nature. Patchy involvement of the pons noted as well. No abnormal foci of restricted diffusion to suggest acute or subacute ischemia. Gray-white matter differentiation maintained. No encephalomalacia to suggest chronic cortical infarction. No acute intracranial hemorrhage. Single subcentimeter focus of susceptibility artifact noted within the parasagittal left occipital lobe, likely a small chronic microhemorrhage, of doubtful significance in isolation. No mass lesion, midline shift or mass effect. No hydrocephalus or extra-axial fluid collection. Pituitary gland and suprasellar region within normal limits. Midline structures intact. Vascular: Major intracranial vascular flow voids are maintained. Skull and upper cervical spine: Craniocervical junction within normal limits. Bone marrow signal intensity normal. No scalp soft tissue abnormality. Sinuses/Orbits: Globes and orbital soft tissues within normal limits. Mild scattered mucosal thickening noted within the ethmoidal air cells and maxillary sinuses. Few small superimposed retention cyst noted within the maxillary sinuses as well. Paranasal sinuses are otherwise clear. No mastoid effusion. Inner ear structures grossly within normal limits. Other: None. IMPRESSION: 1. No acute intracranial infarct or other abnormality. 2. Age-related cerebral atrophy with moderate cerebral white matter disease, nonspecific, but most commonly related to chronic microvascular ischemic disease. Electronically Signed   By: Jeannine Boga M.D.   On: 05/26/2020 22:27    ASSESSMENT: Stage IIIa HCC  PLAN:    1. Stage IIIa HCC: Although patient's AFP is only minimally elevated at 12.1,  biopsy confirmed hepatocellular carcinoma.  PET  scan results reviewed independently confirming stage of disease. He wishes to pursue aggressive treatment and plan on using Tecentriq plus Avastin every 3 weeks until intolerable side effects or progression of disease.  Patient is now had port placement.  Will hold Avastin for the first several cycles and refer patient back to interventional radiology for possible Y 90 ablation of his dominant liver mass. Proceed with treatment today. Return to clinic in 3 weeks for further evaluation and consideration of cycle three. 2. Renal insufficiency: Patient's creatinine has improved to 1.58. 3. Elevated liver enzymes: Chronic and unchanged. 4. Pain: Patient was given a refill of 5 million oxycodone today. 5. Weight loss: Continue to monitor.   I spent a total of 30 minutes reviewing chart data, face-to-face evaluation with the patient, counseling and coordination of care as detailed above.    Patient expressed understanding and was in agreement with this plan. He also understands that He can call clinic at any time with any questions, concerns, or complaints.   Cancer Staging Hepatocellular carcinoma [facility]) Staging form: Liver, AJCC 8th Edition - Clinical stage from 05/01/2020: Stage IIIA (cT3, cN0, cM0) - Signed by Lloyd Huger, MD on 05/01/2020   Lloyd Huger, MD   06/14/2020 7:09 AM

## 2020-06-10 ENCOUNTER — Encounter: Payer: Self-pay | Admitting: Oncology

## 2020-06-11 ENCOUNTER — Other Ambulatory Visit (INDEPENDENT_AMBULATORY_CARE_PROVIDER_SITE_OTHER): Payer: Self-pay | Admitting: Nurse Practitioner

## 2020-06-11 ENCOUNTER — Inpatient Hospital Stay: Payer: Medicare HMO

## 2020-06-11 ENCOUNTER — Inpatient Hospital Stay (HOSPITAL_BASED_OUTPATIENT_CLINIC_OR_DEPARTMENT_OTHER): Payer: Medicare HMO | Admitting: Oncology

## 2020-06-11 ENCOUNTER — Other Ambulatory Visit: Payer: Self-pay

## 2020-06-11 ENCOUNTER — Other Ambulatory Visit: Payer: Self-pay | Admitting: *Deleted

## 2020-06-11 ENCOUNTER — Other Ambulatory Visit
Admission: RE | Admit: 2020-06-11 | Discharge: 2020-06-11 | Disposition: A | Discharge status: Home / Self Care | Payer: Medicare HMO | Source: Ambulatory Visit | Attending: Vascular Surgery | Admitting: Vascular Surgery

## 2020-06-11 VITALS — BP 129/77 | HR 76 | Temp 97.0°F | Resp 16 | Wt 138.1 lb

## 2020-06-11 DIAGNOSIS — Z20822 Contact with and (suspected) exposure to covid-19: Secondary | ICD-10-CM | POA: Insufficient documentation

## 2020-06-11 DIAGNOSIS — C220 Liver cell carcinoma: Secondary | ICD-10-CM

## 2020-06-11 DIAGNOSIS — Z01812 Encounter for preprocedural laboratory examination: Secondary | ICD-10-CM | POA: Insufficient documentation

## 2020-06-11 DIAGNOSIS — Z5111 Encounter for antineoplastic chemotherapy: Secondary | ICD-10-CM | POA: Diagnosis not present

## 2020-06-11 LAB — CBC WITH DIFFERENTIAL/PLATELET
Abs Immature Granulocytes: 0.01 10*3/uL (ref 0.00–0.07)
Basophils Absolute: 0.1 10*3/uL (ref 0.0–0.1)
Basophils Relative: 1 %
Eosinophils Absolute: 0.1 10*3/uL (ref 0.0–0.5)
Eosinophils Relative: 2 %
HCT: 36.2 % — ABNORMAL LOW (ref 39.0–52.0)
Hemoglobin: 12.8 g/dL — ABNORMAL LOW (ref 13.0–17.0)
Immature Granulocytes: 0 %
Lymphocytes Relative: 31 %
Lymphs Abs: 1.7 10*3/uL (ref 0.7–4.0)
MCH: 33.8 pg (ref 26.0–34.0)
MCHC: 35.4 g/dL (ref 30.0–36.0)
MCV: 95.5 fL (ref 80.0–100.0)
Monocytes Absolute: 0.5 10*3/uL (ref 0.1–1.0)
Monocytes Relative: 9 %
Neutro Abs: 3.1 10*3/uL (ref 1.7–7.7)
Neutrophils Relative %: 57 %
Platelets: 170 10*3/uL (ref 150–400)
RBC: 3.79 MIL/uL — ABNORMAL LOW (ref 4.22–5.81)
RDW: 14.8 % (ref 11.5–15.5)
WBC: 5.5 10*3/uL (ref 4.0–10.5)
nRBC: 0 % (ref 0.0–0.2)

## 2020-06-11 LAB — COMPREHENSIVE METABOLIC PANEL
ALT: 60 U/L — ABNORMAL HIGH (ref 0–44)
AST: 73 U/L — ABNORMAL HIGH (ref 15–41)
Albumin: 2.3 g/dL — ABNORMAL LOW (ref 3.5–5.0)
Alkaline Phosphatase: 169 U/L — ABNORMAL HIGH (ref 38–126)
Anion gap: 10 (ref 5–15)
BUN: 23 mg/dL (ref 8–23)
CO2: 25 mmol/L (ref 22–32)
Calcium: 8.8 mg/dL — ABNORMAL LOW (ref 8.9–10.3)
Chloride: 101 mmol/L (ref 98–111)
Creatinine, Ser: 1.58 mg/dL — ABNORMAL HIGH (ref 0.61–1.24)
GFR, Estimated: 47 mL/min — ABNORMAL LOW (ref >60)
Glucose, Bld: 90 mg/dL (ref 70–99)
Potassium: 3.8 mmol/L (ref 3.5–5.1)
Sodium: 136 mmol/L (ref 135–145)
Total Bilirubin: 1.6 mg/dL — ABNORMAL HIGH (ref 0.3–1.2)
Total Protein: 6.9 g/dL (ref 6.5–8.1)

## 2020-06-11 LAB — TSH: TSH: 5.168 u[IU]/mL — ABNORMAL HIGH (ref 0.350–4.500)

## 2020-06-11 LAB — SARS CORONAVIRUS 2 (TAT 6-24 HRS): SARS Coronavirus 2: NEGATIVE

## 2020-06-11 MED ORDER — HEPARIN SOD (PORK) LOCK FLUSH 100 UNIT/ML IV SOLN
500.0000 [IU] | Freq: Once | INTRAVENOUS | Status: AC | PRN
  Administered 2020-06-11: 500 [IU]
  Filled 2020-06-11: qty 5

## 2020-06-11 MED ORDER — OXYCODONE HCL 5 MG PO TABS: 5.0000 mg | ORAL_TABLET | Freq: Four times a day (QID) | ORAL | 0 refills | Status: DC | PRN

## 2020-06-11 MED ORDER — PROCHLORPERAZINE MALEATE 10 MG PO TABS: 10.0000 mg | ORAL_TABLET | Freq: Four times a day (QID) | ORAL | 2 refills | Status: DC | PRN

## 2020-06-11 MED ORDER — SODIUM CHLORIDE 0.9% FLUSH
10.0000 mL | Freq: Once | INTRAVENOUS | Status: AC
  Administered 2020-06-11: 10 mL via INTRAVENOUS
  Filled 2020-06-11: qty 10

## 2020-06-11 MED ORDER — SODIUM CHLORIDE 0.9 % IV SOLN
1200.0000 mg | Freq: Once | INTRAVENOUS | Status: AC
  Administered 2020-06-11: 1200 mg via INTRAVENOUS
  Filled 2020-06-11: qty 20

## 2020-06-11 MED ORDER — SODIUM CHLORIDE 0.9 % IV SOLN
Freq: Once | INTRAVENOUS | Status: AC
  Filled 2020-06-11: qty 250

## 2020-06-11 MED ORDER — HEPARIN SOD (PORK) LOCK FLUSH 100 UNIT/ML IV SOLN
INTRAVENOUS | Status: AC
  Filled 2020-06-11: qty 5

## 2020-06-11 MED ORDER — HEPARIN SOD (PORK) LOCK FLUSH 100 UNIT/ML IV SOLN
500.0000 [IU] | Freq: Once | INTRAVENOUS | Status: DC
  Filled 2020-06-11: qty 5

## 2020-06-11 NOTE — Progress Notes (Signed)
Weight loss since last visit. Re-weighed patient x2.

## 2020-06-12 LAB — T4: T4, Total: 9.7 ug/dL (ref 4.5–12.0)

## 2020-06-12 MED ORDER — CHLORHEXIDINE GLUCONATE 0.12 % MT SOLN: 15.0000 mL | Freq: Once | OROMUCOSAL | Status: AC

## 2020-06-12 MED ORDER — CEFAZOLIN SODIUM-DEXTROSE 2-4 GM/100ML-% IV SOLN
2.0000 g | INTRAVENOUS | Status: AC
  Administered 2020-06-13: 2 g via INTRAVENOUS

## 2020-06-12 MED ORDER — HYDROMORPHONE HCL 1 MG/ML IJ SOLN: 1.0000 mg | Freq: Once | INTRAMUSCULAR | Status: DC | PRN

## 2020-06-12 MED ORDER — ONDANSETRON HCL 4 MG/2ML IJ SOLN: 4.0000 mg | Freq: Four times a day (QID) | INTRAMUSCULAR | Status: DC | PRN

## 2020-06-12 MED ORDER — ORAL CARE MOUTH RINSE: 15.0000 mL | Freq: Once | OROMUCOSAL | Status: AC

## 2020-06-12 MED ORDER — LACTATED RINGERS IV SOLN
Freq: Once | INTRAVENOUS | Status: AC
  Administered 2020-06-13: 50 mL/h via INTRAVENOUS

## 2020-06-12 MED ORDER — FAMOTIDINE 20 MG PO TABS: 20.0000 mg | ORAL_TABLET | Freq: Once | ORAL | Status: AC

## 2020-06-13 ENCOUNTER — Other Ambulatory Visit: Payer: Self-pay

## 2020-06-13 ENCOUNTER — Ambulatory Visit: Payer: Medicare HMO | Admitting: Anesthesiology

## 2020-06-13 ENCOUNTER — Encounter
Admission: RE | Disposition: A | Discharge status: Home / Self Care | Payer: Self-pay | Source: Home / Self Care | Attending: Vascular Surgery

## 2020-06-13 ENCOUNTER — Encounter: Payer: Self-pay | Admitting: Vascular Surgery

## 2020-06-13 ENCOUNTER — Ambulatory Visit
Admission: RE | Admit: 2020-06-13 | Discharge: 2020-06-13 | Disposition: A | Discharge status: Home / Self Care | Payer: Medicare HMO | Attending: Vascular Surgery | Admitting: Vascular Surgery

## 2020-06-13 DIAGNOSIS — Z8669 Personal history of other diseases of the nervous system and sense organs: Secondary | ICD-10-CM | POA: Insufficient documentation

## 2020-06-13 DIAGNOSIS — Z820 Family history of epilepsy and other diseases of the nervous system: Secondary | ICD-10-CM | POA: Insufficient documentation

## 2020-06-13 DIAGNOSIS — I10 Essential (primary) hypertension: Secondary | ICD-10-CM | POA: Insufficient documentation

## 2020-06-13 DIAGNOSIS — R519 Headache, unspecified: Secondary | ICD-10-CM | POA: Insufficient documentation

## 2020-06-13 DIAGNOSIS — F17210 Nicotine dependence, cigarettes, uncomplicated: Secondary | ICD-10-CM | POA: Insufficient documentation

## 2020-06-13 DIAGNOSIS — Z79899 Other long term (current) drug therapy: Secondary | ICD-10-CM | POA: Insufficient documentation

## 2020-06-13 HISTORY — PX: ARTERY BIOPSY: SHX891

## 2020-06-13 LAB — TYPE AND SCREEN
ABO/RH(D): O POS
Antibody Screen: NEGATIVE

## 2020-06-13 LAB — ABO/RH: ABO/RH(D): O POS

## 2020-06-13 SURGERY — BIOPSY TEMPORAL ARTERY
Anesthesia: General | Laterality: Right

## 2020-06-13 SURGERY — BIOPSY TEMPORAL ARTERY
Anesthesia: General | Site: Head | Laterality: Right

## 2020-06-13 MED ORDER — BUPIVACAINE HCL (PF) 0.5 % IJ SOLN
INTRAMUSCULAR | Status: AC
  Filled 2020-06-13: qty 10

## 2020-06-13 MED ORDER — CHLORHEXIDINE GLUCONATE 0.12 % MT SOLN
OROMUCOSAL | Status: AC
  Administered 2020-06-13: 15 mL via OROMUCOSAL
  Filled 2020-06-13: qty 15

## 2020-06-13 MED ORDER — CHLORHEXIDINE GLUCONATE CLOTH 2 % EX PADS: 6.0000 | MEDICATED_PAD | Freq: Once | CUTANEOUS | Status: DC

## 2020-06-13 MED ORDER — FENTANYL CITRATE (PF) 100 MCG/2ML IJ SOLN: 25.0000 ug | INTRAMUSCULAR | Status: DC | PRN

## 2020-06-13 MED ORDER — ONDANSETRON HCL 4 MG/2ML IJ SOLN: 4.0000 mg | Freq: Once | INTRAMUSCULAR | Status: DC | PRN

## 2020-06-13 MED ORDER — CEFAZOLIN SODIUM-DEXTROSE 2-4 GM/100ML-% IV SOLN
INTRAVENOUS | Status: AC
  Filled 2020-06-13: qty 100

## 2020-06-13 MED ORDER — LIDOCAINE-EPINEPHRINE 1 %-1:100000 IJ SOLN
INTRAMUSCULAR | Status: AC
  Filled 2020-06-13: qty 1

## 2020-06-13 MED ORDER — FENTANYL CITRATE (PF) 100 MCG/2ML IJ SOLN
INTRAMUSCULAR | Status: DC | PRN
  Administered 2020-06-13 (×2): 25 ug via INTRAVENOUS

## 2020-06-13 MED ORDER — FAMOTIDINE 20 MG PO TABS
ORAL_TABLET | ORAL | Status: AC
  Administered 2020-06-13: 20 mg via ORAL
  Filled 2020-06-13: qty 1

## 2020-06-13 MED ORDER — LIDOCAINE-EPINEPHRINE 1 %-1:100000 IJ SOLN
INTRAMUSCULAR | Status: DC | PRN
  Administered 2020-06-13: 10 mL

## 2020-06-13 MED ORDER — PROPOFOL 500 MG/50ML IV EMUL
INTRAVENOUS | Status: DC | PRN
  Administered 2020-06-13: 70 ug/kg/min via INTRAVENOUS

## 2020-06-13 MED ORDER — PROPOFOL 10 MG/ML IV BOLUS
INTRAVENOUS | Status: DC | PRN
  Administered 2020-06-13: 50 mg via INTRAVENOUS
  Administered 2020-06-13: 30 mg via INTRAVENOUS

## 2020-06-13 MED ORDER — FENTANYL CITRATE (PF) 100 MCG/2ML IJ SOLN
INTRAMUSCULAR | Status: AC
  Filled 2020-06-13: qty 2

## 2020-06-13 MED ORDER — PROPOFOL 500 MG/50ML IV EMUL
INTRAVENOUS | Status: AC
  Filled 2020-06-13: qty 50

## 2020-06-13 SURGICAL SUPPLY — 44 items
ADH SKN CLS APL DERMABOND .7 (GAUZE/BANDAGES/DRESSINGS) ×1
BLADE SURG 15 STRL LF DISP TIS (BLADE) ×1 IMPLANT
BLADE SURG 15 STRL SS (BLADE) ×2
BLADE SURG SZ11 CARB STEEL (BLADE) ×2 IMPLANT
CNTNR SPEC 2.5X3XGRAD LEK (MISCELLANEOUS)
CONT SPEC 4OZ STER OR WHT (MISCELLANEOUS)
CONT SPEC 4OZ STRL OR WHT (MISCELLANEOUS)
CONTAINER SPEC 2.5X3XGRAD LEK (MISCELLANEOUS) IMPLANT
COTTON BALL STRL MEDIUM (GAUZE/BANDAGES/DRESSINGS) ×2 IMPLANT
COVER WAND RF STERILE (DRAPES) ×2 IMPLANT
DERMABOND ADVANCED (GAUZE/BANDAGES/DRESSINGS) ×1
DERMABOND ADVANCED .7 DNX12 (GAUZE/BANDAGES/DRESSINGS) ×1 IMPLANT
DRAPE LAPAROTOMY 77X122 PED (DRAPES) ×2 IMPLANT
DRSG TELFA 4X3 1S NADH ST (GAUZE/BANDAGES/DRESSINGS) ×2 IMPLANT
ELECT CAUTERY BLADE 6.4 (BLADE) ×2 IMPLANT
ELECT REM PT RETURN 9FT ADLT (ELECTROSURGICAL) ×2
ELECTRODE REM PT RTRN 9FT ADLT (ELECTROSURGICAL) ×1 IMPLANT
GLOVE BIO SURGEON STRL SZ7 (GLOVE) ×2 IMPLANT
GOWN STRL REUS W/ TWL LRG LVL3 (GOWN DISPOSABLE) ×2 IMPLANT
GOWN STRL REUS W/ TWL XL LVL3 (GOWN DISPOSABLE) ×1 IMPLANT
GOWN STRL REUS W/TWL LRG LVL3 (GOWN DISPOSABLE) ×4
GOWN STRL REUS W/TWL XL LVL3 (GOWN DISPOSABLE) ×2
KIT TURNOVER KIT A (KITS) ×2 IMPLANT
LABEL OR SOLS (LABEL) ×2 IMPLANT
MANIFOLD NEPTUNE II (INSTRUMENTS) ×2 IMPLANT
NDL HYPO 25X1 1.5 SAFETY (NEEDLE) IMPLANT
NEEDLE HYPO 25X1 1.5 SAFETY (NEEDLE) ×2 IMPLANT
NS IRRIG 500ML POUR BTL (IV SOLUTION) ×2 IMPLANT
PACK BASIN MINOR (MISCELLANEOUS) ×2 IMPLANT
SOL PREP PVP 2OZ (MISCELLANEOUS) ×2
SOLUTION PREP PVP 2OZ (MISCELLANEOUS) ×1 IMPLANT
SUCTION FRAZIER HANDLE 10FR (MISCELLANEOUS) ×1
SUCTION TUBE FRAZIER 10FR DISP (MISCELLANEOUS) ×1 IMPLANT
SUT MNCRL AB 4-0 PS2 18 (SUTURE) ×2 IMPLANT
SUT SILK 2 0 (SUTURE) ×2
SUT SILK 2-0 18XBRD TIE 12 (SUTURE) ×1 IMPLANT
SUT SILK 3 0 (SUTURE) ×2
SUT SILK 3-0 18XBRD TIE 12 (SUTURE) ×1 IMPLANT
SUT SILK 4 0 (SUTURE) ×2
SUT SILK 4-0 18XBRD TIE 12 (SUTURE) ×1 IMPLANT
SUT VIC AB 3-0 SH 27 (SUTURE) ×2
SUT VIC AB 3-0 SH 27X BRD (SUTURE) ×1 IMPLANT
SYR 10ML LL (SYRINGE) ×1 IMPLANT
SYR BULB IRRIG 60ML STRL (SYRINGE) ×2 IMPLANT

## 2020-06-13 NOTE — Anesthesia Postprocedure Evaluation (Signed)
Anesthesia Post Note  Patient: Keynan Heffern  Procedure(s) Performed: BIOPSY TEMPORAL ARTERY (Right Head)  Patient location during evaluation: PACU Anesthesia Type: General Level of consciousness: awake and alert Pain management: pain level controlled Vital Signs Assessment: post-procedure vital signs reviewed and stable Respiratory status: spontaneous breathing, nonlabored ventilation, respiratory function stable and patient connected to nasal cannula oxygen Cardiovascular status: blood pressure returned to baseline and stable Postop Assessment: no apparent nausea or vomiting Anesthetic complications: no   No complications documented.   Last Vitals:  Vitals:   06/13/20 0848 06/13/20 0904  BP: 135/76 (!) 152/82  Pulse: (!) 59 62  Resp: 11 18  Temp: 36.5 C (!) 35.8 C  SpO2: 99% 100%    Last Pain:  Vitals:   06/13/20 0904  TempSrc: Temporal  PainSc: 0-No pain                 Precious Haws Nyasiah Moffet

## 2020-06-13 NOTE — Op Note (Signed)
        OPERATIVE NOTE   PRE-OPERATIVE DIAGNOSIS: suspected temporal arteritis, headaches  POST-OPERATIVE DIAGNOSIS: Same as above  PROCEDURE: 1.   Right temporal artery biopsy  SURGEON: [name], MD  ASSISTANT(S): none  ANESTHESIA: MAC  ESTIMATED BLOOD LOSS: Minimal  FINDING(S): 1.  none  SPECIMEN(S):  Right  superficial temporal artery sent to pathology  INDICATIONS:   Patient is a 69 y.o. male who presents with headaches and concern for temporal arterities. We were consulted by Dr. Edison Pace for consideration for temporal artery biopsy. Risks and benefits were discussed and he was agreeable to proceed.  DESCRIPTION: After obtaining full informed written consent, the patient was brought back to the operating room and placed supine upon the operating table.  The patient received IV antibiotics prior to induction.  After obtaining adequate anesthesia, the patient was prepped and draped in the standard fashion. The area in front of his right ear was anesthetized copiously with a solution of 1% lidocaine and half percent Marcaine without epinephrine. I then made an incision just in front of the right ear overlying the palpable pulse. I then dissected down through the subcutaneous tissues and identified the superficial temporal artery. This was dissected out over a several centimeters and branches were ligated and divided between silk ties. Care was used to avoid electrocautery around the artery. I then clamped the artery proximally and distally and transected the artery. The specimen was then sent to pathology. The proximal and distal artery were ligated with 3-0 silk ties. Hemostasis was achieved. The wound was then closed with a series of interrupted 3-0 Vicryl's and the skin was closed with a 4-0 Monocryl. Sterile dressing was placed. The patient was taken to the recovery room in stable condition having tolerated the procedure well.  COMPLICATIONS: None  CONDITION: Stable   [name] 06/13/2020 8:13 AM  This note was created with Dragon Medical transcription system. Any errors in dictation are purely unintentional.

## 2020-06-13 NOTE — Interval H&P Note (Signed)
History and Physical Interval Note:  06/13/2020 7:25 AM  Joshua Ramos  has presented today for surgery, with the diagnosis of TEMPORAL ARTERITIS.  The various methods of treatment have been discussed with the patient and family. After consideration of risks, benefits and other options for treatment, the patient has consented to  Procedure(s): BIOPSY TEMPORAL ARTERY (Right) as a surgical intervention.  The patient's history has been reviewed, patient examined, no change in status, stable for surgery.  I have reviewed the patient's chart and labs.  Questions were answered to the patient's satisfaction.     [name]

## 2020-06-13 NOTE — Transfer of Care (Signed)
Immediate Anesthesia Transfer of Care Note  Patient: Joshua Ramos  Procedure(s) Performed: BIOPSY TEMPORAL ARTERY (Right Head)  Patient Location: PACU  Anesthesia Type:General  Level of Consciousness: sedated  Airway & Oxygen Therapy: Patient Spontanous Breathing and Patient connected to face mask oxygen  Post-op Assessment: Report given to RN and Post -op Vital signs reviewed and stable  Post vital signs: Reviewed and stable  Last Vitals:  Vitals Value Taken Time  BP 98/63 06/13/20 0817  Temp 36 C 06/13/20 0817  Pulse 61 06/13/20 0817  Resp 8 06/13/20 0817  SpO2 100 % 06/13/20 0817  Vitals shown include unvalidated device data.  Last Pain:  Vitals:   06/13/20 0653  TempSrc: Oral  PainSc: 0-No pain      Patients Stated Pain Goal: 0 (29/56/21 3086)  Complications: No complications documented.

## 2020-06-13 NOTE — Discharge Instructions (Signed)

## 2020-06-13 NOTE — Anesthesia Preprocedure Evaluation (Signed)
Anesthesia Evaluation  Patient identified by MRN, date of birth, ID band Patient awake    Reviewed: Allergy & Precautions, NPO status , Patient's Chart, lab work & pertinent test results  History of Anesthesia Complications Negative for: history of anesthetic complications  Airway Mallampati: II       Dental   Pulmonary neg sleep apnea, neg COPD, Current Smoker,           Cardiovascular hypertension, Pt. on medications (-) Past MI and (-) CHF (-) dysrhythmias (-) Valvular Problems/Murmurs     Neuro/Psych neg Seizures CVA (R sided "feels like it's asleep"), Residual Symptoms    GI/Hepatic neg GERD  ,(+) Hepatitis -, C  Endo/Other  neg diabetes  Renal/GU negative Renal ROS     Musculoskeletal   Abdominal   Peds  Hematology   Anesthesia Other Findings   Reproductive/Obstetrics                             Anesthesia Physical Anesthesia Plan  ASA: III  Anesthesia Plan: General   Post-op Pain Management:    Induction: Intravenous  PONV Risk Score and Plan: 2 and Propofol infusion and TIVA  Airway Management Planned: Nasal Cannula  Additional Equipment:   Intra-op Plan:   Post-operative Plan:   Informed Consent: I have reviewed the patients History and Physical, chart, labs and discussed the procedure including the risks, benefits and alternatives for the proposed anesthesia with the patient or authorized representative who has indicated his/her understanding and acceptance.       Plan Discussed with:   Anesthesia Plan Comments:         Anesthesia Quick Evaluation

## 2020-06-16 LAB — SURGICAL PATHOLOGY

## 2020-06-18 ENCOUNTER — Ambulatory Visit: Payer: Medicare HMO | Admitting: Oncology

## 2020-06-18 ENCOUNTER — Other Ambulatory Visit: Payer: Medicare HMO

## 2020-06-18 ENCOUNTER — Ambulatory Visit: Payer: Medicare HMO

## 2020-06-27 ENCOUNTER — Other Ambulatory Visit: Payer: Self-pay

## 2020-06-27 ENCOUNTER — Encounter (INDEPENDENT_AMBULATORY_CARE_PROVIDER_SITE_OTHER): Payer: Self-pay | Admitting: Nurse Practitioner

## 2020-06-27 ENCOUNTER — Other Ambulatory Visit: Payer: Self-pay | Admitting: *Deleted

## 2020-06-27 ENCOUNTER — Ambulatory Visit (INDEPENDENT_AMBULATORY_CARE_PROVIDER_SITE_OTHER): Payer: Medicare HMO | Admitting: Nurse Practitioner

## 2020-06-27 VITALS — BP 153/85 | HR 89 | Ht 73.0 in | Wt 146.0 lb

## 2020-06-27 DIAGNOSIS — I10 Essential (primary) hypertension: Secondary | ICD-10-CM

## 2020-06-27 DIAGNOSIS — C220 Liver cell carcinoma: Secondary | ICD-10-CM

## 2020-06-27 DIAGNOSIS — R519 Headache, unspecified: Secondary | ICD-10-CM

## 2020-06-27 MED ORDER — OXYCODONE HCL 5 MG PO TABS: 5.0000 mg | ORAL_TABLET | Freq: Four times a day (QID) | ORAL | 0 refills | Status: DC | PRN

## 2020-06-27 MED ORDER — PROCHLORPERAZINE MALEATE 10 MG PO TABS: 10.0000 mg | ORAL_TABLET | Freq: Four times a day (QID) | ORAL | 2 refills | Status: AC | PRN

## 2020-06-28 NOTE — Progress Notes (Signed)
[place]  [phone] [phone]  ID: Gaylyn Lambert OB: [birth year]  MR#: [id number]  CBJ#:[id number]  Patient Care Team: Romualdo Bolk, FNP as PCP - General (Nurse Practitioner) Clent Jacks, RN as Oncology Nurse Navigator  CHIEF COMPLAINT: Stage IIIa [facility]  INTERVAL HISTORY: Patient returns to clinic today for further evaluation and consideration of cycle 3 of Tecentriq.  He continues to have chronic weakness and fatigue, but otherwise feels well.  He does admit to mild, intermittent abdominal pain.  Avastin is on hold secondary to possible Y 90 ablation in the future. His appetite has improved and his weight is stable. He continues to have right leg weakness.  He has no other neurologic complaints. He denies any recent fevers or illnesses.  He has no chest pain, shortness of breath, cough, or hemoptysis. He denies any abdominal pain. He denies any nausea, vomiting, constipation, or diarrhea.  He has occasional urinary retention.  Patient offers no further specific complaints today.  REVIEW OF SYSTEMS:   Review of Systems  Constitutional: Positive for malaise/fatigue. Negative for fever and weight loss.  Respiratory: Negative.  Negative for cough and shortness of breath.   Cardiovascular: Negative.  Negative for chest pain and leg swelling.  Gastrointestinal: Positive for abdominal pain. Negative for blood in stool, constipation, nausea and vomiting.  Genitourinary: Negative.  Negative for flank pain.  Musculoskeletal: Negative.  Negative for back pain.  Skin: Negative.  Negative for rash.  Neurological: Positive for focal weakness and weakness. Negative for dizziness and headaches.  Psychiatric/Behavioral: Negative.  The patient is not nervous/anxious.     As per HPI. Otherwise, a complete review of systems is negative.  PAST MEDICAL HISTORY: Past Medical History:  Diagnosis Date  . Cancer (HCC)    liver/LUNG  . Headache     almost completely unbearable.  only time improves this  . Hepatitis A 04/2020   C+  . Hypertension   . Stroke [facility]) 2010   right sided weakness remains    PAST SURGICAL HISTORY: Past Surgical History:  Procedure Laterality Date  . ARTERY BIOPSY Right 06/13/2020   Procedure: BIOPSY TEMPORAL ARTERY;  Surgeon: Algernon Huxley, MD;  Location: [facility] ORS;  Service: Vascular;  Laterality: Right;  . IR IMAGING GUIDED PORT INSERTION  05/02/2020    FAMILY HISTORY: Family History  Problem Relation Age of Onset  . Aneurysm Mother   . Diabetes Father     ADVANCED DIRECTIVES (Y/N):  N  HEALTH MAINTENANCE: Social History   Tobacco Use  . Smoking status: Former Smoker    Packs/day: 0.50    Years: 40.00    Pack years: 20.00    Types: Cigarettes  . Smokeless tobacco: Never Used  . Tobacco comment: has not smoked in one month  Vaping Use  . Vaping Use: Never used  Substance Use Topics  . Alcohol use: Not Currently  . Drug use: Not Currently    Comment: PTS SISTER Hobson HE HAS USED DRUGS IN THE PAST-UNSURE WHICH DRUGS WERE USED AND HOW LONG AGO     Colonoscopy:  PAP:  Bone density:  Lipid panel:  Not on File  Current Outpatient Medications  Medication Sig Dispense Refill  . buPROPion (WELLBUTRIN SR) 150 MG 12 hr tablet Take 1 tablet by mouth in the morning and at bedtime.    . lidocaine-prilocaine (EMLA) cream Apply to affected area once 30 g 3  . lisinopril-hydrochlorothiazide (ZESTORETIC) 20-12.5 MG tablet Take 1 tablet by  mouth daily.    . naproxen (NAPROSYN) 500 MG tablet Take 500 mg by mouth every 12 (twelve) hours as needed.     Marland Kitchen oxyCODONE (OXY IR/ROXICODONE) 5 MG immediate release tablet Take 1 tablet (5 mg total) by mouth every 6 (six) hours as needed for severe pain. 30 tablet 0  . capsicum (ZOSTRIX) 0.075 % topical cream Apply 1 application topically every 8 (eight) hours.     . prochlorperazine (COMPAZINE) 10 MG tablet Take 1 tablet (10 mg total) by mouth every 6  (six) hours as needed (Nausea or vomiting). (Patient not taking: Reported on 07/02/2020) 60 tablet 2   No current facility-administered medications for this visit.    OBJECTIVE: Vitals:   07/02/20 1117  BP: 140/81  Pulse: 80  Resp: 18  Temp: (!) 97.5 F (36.4 C)  SpO2: 100%     Body mass index is 19.27 kg/m.    ECOG FS:1 - Symptomatic but completely ambulatory  General: Well-developed, well-nourished, no acute distress. Eyes: Pink conjunctiva, anicteric sclera. HEENT: Normocephalic, moist mucous membranes. Lungs: No audible wheezing or coughing. Heart: Regular rate and rhythm. Abdomen: Soft, nontender, no obvious distention. Musculoskeletal: No edema, cyanosis, or clubbing. Neuro: Alert, answering all questions appropriately. Cranial nerves grossly intact. Skin: No rashes or petechiae noted. Psych: Normal affect.  LAB RESULTS:  Lab Results  Component Value Date   NA 135 07/02/2020   K 4.1 07/02/2020   CL 102 07/02/2020   CO2 25 07/02/2020   GLUCOSE 99 07/02/2020   BUN 16 07/02/2020   CREATININE 1.11 07/02/2020   CALCIUM 8.2 (L) 07/02/2020   PROT 6.5 07/02/2020   ALBUMIN 2.0 (L) 07/02/2020   AST 102 (H) 07/02/2020   ALT 53 (H) 07/02/2020   ALKPHOS 211 (H) 07/02/2020   BILITOT 1.2 07/02/2020   GFRNONAA >60 07/02/2020   GFRAA >60 04/04/2020    Lab Results  Component Value Date   WBC 5.0 07/02/2020   NEUTROABS 3.1 07/02/2020   HGB 11.6 (L) 07/02/2020   HCT 33.5 (L) 07/02/2020   MCV 97.1 07/02/2020   PLT 209 07/02/2020     STUDIES: No results found.  ASSESSMENT: Stage IIIa HCC  PLAN:    1. Stage IIIa HCC: Although patient's AFP is only minimally elevated at 12.1, biopsy confirmed hepatocellular carcinoma.  PET scan results reviewed independently confirming stage of disease. He wishes to pursue aggressive treatment and plan on using Tecentriq plus Avastin every 3 weeks until intolerable side effects or progression of disease.  Patient is now had port  placement.  Will continue to hold Avastin for the first several cycles and refer patient back to interventional radiology for possible Y 90 ablation of his dominant liver mass.  Proceed with cycle 3 today.  Return to clinic in 3 weeks for further evaluation and consideration of cycle 4. 2. Renal insufficiency: Resolved. 3. Elevated liver enzymes: Chronic and unchanged.  AST and ALT are 102 and 53 respectively.  Proceed with treatment as above. 4. Pain: Continue oxycodone as needed. 5. Weight loss: Improving.  I spent a total of 30 minutes reviewing chart data, face-to-face evaluation with the patient, counseling and coordination of care as detailed above.    Patient expressed understanding and was in agreement with this plan. He also understands that He can call clinic at any time with any questions, concerns, or complaints.   Cancer Staging Hepatocellular carcinoma [facility]) Staging form: Liver, AJCC 8th Edition - Clinical stage from 05/01/2020: Stage IIIA (cT3, cN0, cM0) -  Signed by Lloyd Huger, MD on 05/01/2020   Lloyd Huger, MD   07/03/2020 6:45 AM

## 2020-07-02 ENCOUNTER — Inpatient Hospital Stay (HOSPITAL_BASED_OUTPATIENT_CLINIC_OR_DEPARTMENT_OTHER): Payer: Medicare HMO | Admitting: Oncology

## 2020-07-02 ENCOUNTER — Inpatient Hospital Stay: Payer: Medicare HMO

## 2020-07-02 ENCOUNTER — Encounter: Payer: Self-pay | Admitting: Oncology

## 2020-07-02 ENCOUNTER — Encounter (INDEPENDENT_AMBULATORY_CARE_PROVIDER_SITE_OTHER): Payer: Self-pay | Admitting: Nurse Practitioner

## 2020-07-02 ENCOUNTER — Inpatient Hospital Stay: Payer: Medicare HMO | Attending: Oncology

## 2020-07-02 VITALS — BP 140/81 | HR 80 | Temp 97.5°F | Resp 18 | Wt 146.1 lb

## 2020-07-02 DIAGNOSIS — C220 Liver cell carcinoma: Secondary | ICD-10-CM

## 2020-07-02 DIAGNOSIS — Z5111 Encounter for antineoplastic chemotherapy: Secondary | ICD-10-CM | POA: Insufficient documentation

## 2020-07-02 DIAGNOSIS — R634 Abnormal weight loss: Secondary | ICD-10-CM | POA: Diagnosis not present

## 2020-07-02 DIAGNOSIS — Z79899 Other long term (current) drug therapy: Secondary | ICD-10-CM | POA: Insufficient documentation

## 2020-07-02 DIAGNOSIS — R748 Abnormal levels of other serum enzymes: Secondary | ICD-10-CM | POA: Diagnosis not present

## 2020-07-02 DIAGNOSIS — Z8673 Personal history of transient ischemic attack (TIA), and cerebral infarction without residual deficits: Secondary | ICD-10-CM | POA: Diagnosis not present

## 2020-07-02 DIAGNOSIS — Z87891 Personal history of nicotine dependence: Secondary | ICD-10-CM | POA: Insufficient documentation

## 2020-07-02 DIAGNOSIS — Z95828 Presence of other vascular implants and grafts: Secondary | ICD-10-CM

## 2020-07-02 LAB — COMPREHENSIVE METABOLIC PANEL
ALT: 53 U/L — ABNORMAL HIGH (ref 0–44)
AST: 102 U/L — ABNORMAL HIGH (ref 15–41)
Albumin: 2 g/dL — ABNORMAL LOW (ref 3.5–5.0)
Alkaline Phosphatase: 211 U/L — ABNORMAL HIGH (ref 38–126)
Anion gap: 8 (ref 5–15)
BUN: 16 mg/dL (ref 8–23)
CO2: 25 mmol/L (ref 22–32)
Calcium: 8.2 mg/dL — ABNORMAL LOW (ref 8.9–10.3)
Chloride: 102 mmol/L (ref 98–111)
Creatinine, Ser: 1.11 mg/dL (ref 0.61–1.24)
GFR, Estimated: >60 mL/min (ref >60)
Glucose, Bld: 99 mg/dL (ref 70–99)
Potassium: 4.1 mmol/L (ref 3.5–5.1)
Sodium: 135 mmol/L (ref 135–145)
Total Bilirubin: 1.2 mg/dL (ref 0.3–1.2)
Total Protein: 6.5 g/dL (ref 6.5–8.1)

## 2020-07-02 LAB — CBC WITH DIFFERENTIAL/PLATELET
Abs Immature Granulocytes: 0.05 10*3/uL (ref 0.00–0.07)
Basophils Absolute: 0 10*3/uL (ref 0.0–0.1)
Basophils Relative: 0 %
Eosinophils Absolute: 0.2 10*3/uL (ref 0.0–0.5)
Eosinophils Relative: 4 %
HCT: 33.5 % — ABNORMAL LOW (ref 39.0–52.0)
Hemoglobin: 11.6 g/dL — ABNORMAL LOW (ref 13.0–17.0)
Immature Granulocytes: 1 %
Lymphocytes Relative: 23 %
Lymphs Abs: 1.1 10*3/uL (ref 0.7–4.0)
MCH: 33.6 pg (ref 26.0–34.0)
MCHC: 34.6 g/dL (ref 30.0–36.0)
MCV: 97.1 fL (ref 80.0–100.0)
Monocytes Absolute: 0.5 10*3/uL (ref 0.1–1.0)
Monocytes Relative: 10 %
Neutro Abs: 3.1 10*3/uL (ref 1.7–7.7)
Neutrophils Relative %: 62 %
Platelets: 209 10*3/uL (ref 150–400)
RBC: 3.45 MIL/uL — ABNORMAL LOW (ref 4.22–5.81)
RDW: 15.4 % (ref 11.5–15.5)
WBC: 5 10*3/uL (ref 4.0–10.5)
nRBC: 0 % (ref 0.0–0.2)

## 2020-07-02 LAB — TSH: TSH: 4.34 u[IU]/mL (ref 0.350–4.500)

## 2020-07-02 MED ORDER — SODIUM CHLORIDE 0.9 % IV SOLN
1200.0000 mg | Freq: Once | INTRAVENOUS | Status: AC
  Administered 2020-07-02: 1200 mg via INTRAVENOUS
  Filled 2020-07-02: qty 20

## 2020-07-02 MED ORDER — HEPARIN SOD (PORK) LOCK FLUSH 100 UNIT/ML IV SOLN
500.0000 [IU] | Freq: Once | INTRAVENOUS | Status: AC
  Administered 2020-07-02: 500 [IU] via INTRAVENOUS
  Filled 2020-07-02: qty 5

## 2020-07-02 MED ORDER — HEPARIN SOD (PORK) LOCK FLUSH 100 UNIT/ML IV SOLN
INTRAVENOUS | Status: AC
  Filled 2020-07-02: qty 5

## 2020-07-02 MED ORDER — SODIUM CHLORIDE 0.9 % IV SOLN
Freq: Once | INTRAVENOUS | Status: AC
  Filled 2020-07-02: qty 250

## 2020-07-02 MED ORDER — SODIUM CHLORIDE 0.9% FLUSH
10.0000 mL | Freq: Once | INTRAVENOUS | Status: AC
  Administered 2020-07-02: 10 mL via INTRAVENOUS
  Filled 2020-07-02: qty 10

## 2020-07-02 NOTE — Progress Notes (Signed)
1212- Per MD, Dr. Grayland Ormond, order: patient to receive Tecentriq treatment only today; no Zirabev treatment today.  1315- Patient tolerated treatment well. Patient stable and discharged to home at this time.

## 2020-07-02 NOTE — Progress Notes (Signed)
Subjective:    Patient ID: Joshua Ramos, male    DOB: [birth year], 69 y.o.   MRN: [id number] Chief Complaint  Patient presents with  . Follow-up    2 week [facility] post Biopsy temporal Artery wound re check     Patient presents today after temporal artery biopsy on 06/13/2020.  The patient notes that he tolerated the procedure well without significant fever, chills, nausea, vomiting or diarrhea.  The patient has a wound following the procedure and he notes that it has been healing well without any drainage or dehiscence from the area.  The patient notes that he has continued to have headaches and remains on steroids currently.  He denies any fever, chills, nausea, vomiting or diarrhea postsurgically.   Review of Systems  Skin: Positive for wound.  All other systems reviewed and are negative.      Objective:   Physical Exam Vitals reviewed.  HENT:     Head: Normocephalic.  Cardiovascular:     Rate and Rhythm: Normal rate.     Pulses: Normal pulses.  Pulmonary:     Effort: Pulmonary effort is normal.  Neurological:     Mental Status: He is alert and oriented to person, place, and time.  Psychiatric:        Mood and Affect: Mood normal.        Behavior: Behavior normal.        Thought Content: Thought content normal.        Judgment: Judgment normal.     BP (!) 153/85   Pulse 89   Ht 6\' 1"  (1.854 m)   Wt 146 lb (66.2 kg)   BMI 19.26 kg/m   Past Medical History:  Diagnosis Date  . Cancer (HCC)    liver/LUNG  . Headache    almost completely unbearable.  only time improves this  . Hepatitis A 04/2020   C+  . Hypertension   . Stroke [facility]) 2010   right sided weakness remains    Social History   Socioeconomic History  . Marital status: Single    Spouse name: Not on file  . Number of children: 0  . Years of education: Not on file  . Highest education level: Not on file  Occupational History  . Occupation: retired  Tobacco Use  . Smoking status: Former  Smoker    Packs/day: 0.50    Years: 40.00    Pack years: 20.00    Types: Cigarettes  . Smokeless tobacco: Never Used  . Tobacco comment: has not smoked in one month  Vaping Use  . Vaping Use: Never used  Substance and Sexual Activity  . Alcohol use: Not Currently  . Drug use: Not Currently    Comment: PTS SISTER JANICE STATES HE HAS USED DRUGS IN THE PAST-UNSURE WHICH DRUGS WERE USED AND HOW LONG AGO  . Sexual activity: Not Currently  Other Topics Concern  . Not on file  Social History Narrative   Lives by himself.     Feels safe in his home.  Does not drive.   Sister will keep him after surgery.   Social Determinants of Health   Financial Resource Strain:   . Difficulty of Paying Living Expenses: Not on file  Food Insecurity:   . Worried About Charity fundraiser in the Last Year: Not on file  . Ran Out of Food in the Last Year: Not on file  Transportation Needs:   . Lack of Transportation (Medical): Not on  file  . Lack of Transportation (Non-Medical): Not on file  Physical Activity:   . Days of Exercise per Week: Not on file  . Minutes of Exercise per Session: Not on file  Stress:   . Feeling of Stress : Not on file  Social Connections:   . Frequency of Communication with Friends and Family: Not on file  . Frequency of Social Gatherings with Friends and Family: Not on file  . Attends Religious Services: Not on file  . Active Member of Clubs or Organizations: Not on file  . Attends Archivist Meetings: Not on file  . Marital Status: Not on file  Intimate Partner Violence:   . Fear of Current or Ex-Partner: Not on file  . Emotionally Abused: Not on file  . Physically Abused: Not on file  . Sexually Abused: Not on file    Past Surgical History:  Procedure Laterality Date  . ARTERY BIOPSY Right 06/13/2020   Procedure: BIOPSY TEMPORAL ARTERY;  Surgeon: Algernon Huxley, MD;  Location: [facility] ORS;  Service: Vascular;  Laterality: Right;  . IR IMAGING GUIDED PORT  INSERTION  05/02/2020    Family History  Problem Relation Age of Onset  . Aneurysm Mother   . Diabetes Father     Not on File  CBC Latest Ref Rng & Units 06/11/2020 05/28/2020 05/26/2020  WBC 4.0 - 10.5 K/uL 5.5 14.0(H) 7.6  Hemoglobin 13.0 - 17.0 g/dL 12.8(L) 11.5(L) 12.5(L)  Hematocrit 39 - 52 % 36.2(L) 32.8(L) 34.8(L)  Platelets 150 - 400 K/uL 170 154 148(L)      CMP     Component Value Date/Time   NA 136 06/11/2020 0828   NA 136 05/08/2014 1620   K 3.8 06/11/2020 0828   K 4.0 05/08/2014 1620   CL 101 06/11/2020 0828   CL 104 05/08/2014 1620   CO2 25 06/11/2020 0828   CO2 25 05/08/2014 1620   GLUCOSE 90 06/11/2020 0828   GLUCOSE 89 05/08/2014 1620   BUN 23 06/11/2020 0828   BUN 14 05/08/2014 1620   CREATININE 1.58 (H) 06/11/2020 0828   CREATININE 1.12 05/08/2014 1620   CALCIUM 8.8 (L) 06/11/2020 0828   CALCIUM 8.4 (L) 05/08/2014 1620   PROT 6.9 06/11/2020 0828   PROT 8.2 05/08/2014 1620   ALBUMIN 2.3 (L) 06/11/2020 0828   ALBUMIN 3.2 (L) 05/08/2014 1620   AST 73 (H) 06/11/2020 0828   AST 116 (H) 05/08/2014 1620   ALT 60 (H) 06/11/2020 0828   ALT 137 (H) 05/08/2014 1620   ALKPHOS 169 (H) 06/11/2020 0828   ALKPHOS 91 05/08/2014 1620   BILITOT 1.6 (H) 06/11/2020 0828   BILITOT 1.3 (H) 05/08/2014 1620   GFRNONAA 47 (L) 06/11/2020 0828   GFRNONAA >60 05/08/2014 1620   GFRAA >60 04/04/2020 1007   GFRAA >60 05/08/2014 1620     No results found.     Assessment & Plan:   1. Severe frontal headaches Despite being on steroids the patient still has has continued headache although it somewhat less.  Patient's temporal artery biopsy was negative.  However, the patient was on steroids for longer than a week which can result in a false negative.  The patient's wound is clean dry and intact with no evidence of dehiscence.  Patient will continue to follow with PCP for further work-up of frontal headaches.  Patient will see [place] on an as-needed basis.  2. Essential  (primary) hypertension Continue antihypertensive medications as already ordered, these medications have been  reviewed and there are no changes at this time.    Current Outpatient Medications on File Prior to Visit  Medication Sig Dispense Refill  . buPROPion (WELLBUTRIN SR) 150 MG 12 hr tablet Take 1 tablet by mouth in the morning and at bedtime.    . capsicum (ZOSTRIX) 0.075 % topical cream Apply 1 application topically every 8 (eight) hours.     . lidocaine-prilocaine (EMLA) cream Apply to affected area once 30 g 3  . lisinopril-hydrochlorothiazide (ZESTORETIC) 20-12.5 MG tablet Take 1 tablet by mouth daily.    . naproxen (NAPROSYN) 500 MG tablet Take 500 mg by mouth every 12 (twelve) hours as needed.      No current facility-administered medications on file prior to visit.    There are no Patient Instructions on file for this visit. No follow-ups on file.   Kris Hartmann, NP

## 2020-07-03 ENCOUNTER — Other Ambulatory Visit: Payer: Self-pay

## 2020-07-03 ENCOUNTER — Other Ambulatory Visit: Payer: Self-pay | Admitting: Interventional Radiology

## 2020-07-03 ENCOUNTER — Ambulatory Visit
Admission: RE | Admit: 2020-07-03 | Discharge: 2020-07-03 | Disposition: A | Discharge status: Home / Self Care | Payer: Medicare HMO | Source: Ambulatory Visit | Attending: Oncology | Admitting: Oncology

## 2020-07-03 DIAGNOSIS — C220 Liver cell carcinoma: Secondary | ICD-10-CM

## 2020-07-03 HISTORY — PX: IR RADIOLOGIST EVAL & MGMT: IMG5224

## 2020-07-03 NOTE — Consult Note (Signed)
Chief Complaint: Patient was consulted remotely today (TeleHealth) for multifocal hepatocellular carcinoma at the request of Finnegan,Timothy J.    Referring Physician(s): Finnegan,Timothy J  History of Present Illness: Joshua Ramos is a 69 y.o. 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  His AFP remains normal consistent with nonsecretory type hepatocellular carcinoma.  His disease was biopsy-proven back in September.  He has now undergone 3 cycles of Tecentriq which he tolerates fairly well.  He has some chronic weakness and fatigue but otherwise feels well.  He reports that he is able to perform all of his activities of daily living and does not spend any time in bed other than normal sleeping at night.  His daughter is present with him on the telephone today.  On review of systems, he complains of some mild left upper quadrant pain.  He denies nausea, vomiting, significant weight loss, fever or chills.  Past Medical History:  Diagnosis Date  . Cancer (HCC)    liver/LUNG  . Headache    almost completely unbearable.  only time improves this  . Hepatitis A 04/2020   C+  . Hypertension   . Stroke [facility]) 2010   right sided weakness remains    Past Surgical History:  Procedure Laterality Date  . ARTERY BIOPSY Right 06/13/2020   Procedure: BIOPSY TEMPORAL ARTERY;  Surgeon: Algernon Huxley, MD;  Location: [facility] ORS;  Service: Vascular;  Laterality: Right;  . IR IMAGING GUIDED PORT INSERTION  05/02/2020    Allergies: Patient has no allergy information on record.  Medications: Prior to Admission medications   Medication Sig Start Date End Date Taking? Authorizing Provider  buPROPion (WELLBUTRIN SR) 150 MG 12 hr tablet Take 1 tablet by mouth in the morning and at bedtime. 05/15/15   [provider]  capsicum (ZOSTRIX)  0.075 % topical cream Apply 1 application topically every 8 (eight) hours.  08/03/11   [provider]  lidocaine-prilocaine (EMLA) cream Apply to affected area once 04/23/20   Lloyd Huger, MD  lisinopril-hydrochlorothiazide (ZESTORETIC) 20-12.5 MG tablet Take 1 tablet by mouth daily. 03/19/20 03/19/21  [provider]  naproxen (NAPROSYN) 500 MG tablet Take 500 mg by mouth every 12 (twelve) hours as needed.  07/18/19 07/17/20  [provider]  oxyCODONE (OXY IR/ROXICODONE) 5 MG immediate release tablet Take 1 tablet (5 mg total) by mouth every 6 (six) hours as needed for severe pain. 06/27/20   Borders, Kirt Boys, NP  prochlorperazine (COMPAZINE) 10 MG tablet Take 1 tablet (10 mg total) by mouth every 6 (six) hours as needed (Nausea or vomiting). Patient not taking: Reported on 07/02/2020 06/27/20   Borders, Kirt Boys, NP     Family History  Problem Relation Age of Onset  . Aneurysm Mother   . Diabetes Father     Social History   Socioeconomic History  . Marital status: Single    Spouse name: Not on file  . Number of children: 0  . Years of education: Not on file  . Highest education level: Not on file  Occupational History  . Occupation: retired  Tobacco Use  . Smoking status: Former Smoker    Packs/day: 0.50    Years: 40.00    Pack years: 20.00    Types: Cigarettes  . Smokeless tobacco: Never Used  . Tobacco comment: has not smoked in one month  Vaping Use  . Vaping Use: Never used  Substance and Sexual Activity  . Alcohol use: Not Currently  . Drug use: Not Currently    Comment: PTS SISTER JANICE STATES HE HAS USED DRUGS IN THE PAST-UNSURE WHICH DRUGS WERE USED AND HOW LONG AGO  . Sexual activity: Not Currently  Other Topics Concern  . Not on file  Social History Narrative   Lives by himself.     Feels safe in his home.  Does not drive.   Sister will keep him after surgery.   Social Determinants of Health   Financial Resource Strain: Not  on file  Food Insecurity: Not on file  Transportation Needs: Not on file  Physical Activity: Not on file  Stress: Not on file  Social Connections: Not on file    ECOG Status: 1 - Symptomatic but completely ambulatory  Review of Systems  Review of Systems: A 12 point ROS discussed and pertinent positives are indicated in the HPI above.  All other systems are negative.  Physical Exam No direct physical exam was performed (except for noted visual exam findings with Video Visits).   Vital Signs: There were no vitals taken for this visit.  Imaging: No results found.  Labs:  CBC: Recent Labs    05/26/20 1422 05/28/20 1013 06/11/20 0828 07/02/20 1056  WBC 7.6 14.0* 5.5 5.0  HGB 12.5* 11.5* 12.8* 11.6*  HCT 34.8* 32.8* 36.2* 33.5*  PLT 148* 154 170 209    COAGS: Recent Labs    04/01/20 1324 04/15/20 1009  INR 1.4* 1.2    BMP: Recent Labs    04/01/20 1023 04/04/20 1007 05/07/20 0943 05/26/20 1422 05/28/20 1013 06/11/20 0828 07/02/20 1056  NA 133* 133*   < > 132* 134* 136 135  K 4.3 4.6   < > 4.0 4.5 3.8 4.1  CL 101 98   < > 101 103 101 102  CO2 28 27   < > 25 25 25 25   GLUCOSE 90 121*   < > 116* 192* 90 99  BUN 11 11   < > 27* 59* 23 16  CALCIUM 8.2* 8.2*   < > 8.4* 8.7* 8.8* 8.2*  CREATININE 1.02 1.09   < > 1.41* 2.12* 1.58* 1.11  GFRNONAA >60 >60   < > 54* 33* 47* >60  GFRAA >60 >60  --   --   --   --   --    < > = values in this interval not displayed.    LIVER FUNCTION TESTS: Recent Labs    05/26/20 1426 05/28/20 1013 06/11/20 0828 07/02/20 1056  BILITOT 3.1* 1.7* 1.6* 1.2  AST 139* 79* 73* 102*  ALT 56* 48* 60* 53*  ALKPHOS 181* 189* 169* 211*  PROT 7.3 7.1 6.9 6.5  ALBUMIN 1.9* 1.9* 2.3* 2.0*    TUMOR MARKERS: No results for input(s): AFPTM, CEA, CA199, CHROMGRNA in the last 8760 hours.  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  Tumor type is nonsecreting of AFP (normal at 12.9).  Liver biopsy was performed on 04/15/2020.   Patient has been tolerating chemotherapy with Tecentriq.  Avastin has been held in preparation for possible transarterial radioembolization with Y 90.  The risks, benefits and alternatives to transarterial radio embolization were discussed with the patient and his daughter.  He understands that he has by lobar disease in the  entire treatment would take a total of 3 sessions, and initial planning session followed by left lobar, and then right lobar treatment sessions.  He understands the potential adverse effects including postembolization syndrome and degradation of hepatic function.  He understands and continues to prefer an aggressive treatment strategy.  He would like to proceed with transarterial radio embolization.  I believe he is a good candidate.  One caveat, his most recent cross-sectional imaging is from 04/02/2020.  He has since been on chemotherapy.  In order to optimize his dose, I need more current imaging.  1.)  MRI abdomen with gadolinium contrast to be performed at [facility] as soon as possible.  2.)  Also begin scheduling patient for pre-Y 90 planning session followed by left lobar, and then right lobar treatment sessions.   Thank you for this interesting consult.  I greatly enjoyed meeting Joshua Ramos and look forward to participating in their care.  A copy of this report was sent to the requesting provider on this date.  Electronically Signed: Criselda Peaches 07/03/2020, 10:13 AM   I spent a total of 40 Minutes  in remote  clinical consultation, greater than 50% of which was counseling/coordinating care for multifocal hepatocellular carcinoma.  Visit type: Audio only (telephone). Audio (no video) only due to patient preference. Alternative for in-person consultation at [facility]  [address] [place], [place], [place]. This visit type was conducted due to national recommendations for restrictions regarding the COVID-19 Pandemic (e.g. social distancing).  This format is felt to be most appropriate for this patient at this time.  All issues noted in this document were discussed and addressed.

## 2020-07-04 LAB — T4: T4, Total: 10.4 ug/dL (ref 4.5–12.0)

## 2020-07-07 ENCOUNTER — Other Ambulatory Visit: Payer: Self-pay | Admitting: *Deleted

## 2020-07-07 MED ORDER — OXYCODONE HCL 5 MG PO TABS: 5.0000 mg | ORAL_TABLET | Freq: Four times a day (QID) | ORAL | 0 refills | Status: DC | PRN

## 2020-07-08 ENCOUNTER — Other Ambulatory Visit: Payer: Self-pay

## 2020-07-08 ENCOUNTER — Emergency Department
Admission: EM | Admit: 2020-07-08 | Discharge: 2020-07-09 | Disposition: A | Discharge status: Home / Self Care | Payer: Medicare HMO | Attending: Emergency Medicine | Admitting: Emergency Medicine

## 2020-07-08 DIAGNOSIS — R110 Nausea: Secondary | ICD-10-CM | POA: Diagnosis not present

## 2020-07-08 DIAGNOSIS — R1012 Left upper quadrant pain: Secondary | ICD-10-CM | POA: Diagnosis not present

## 2020-07-08 DIAGNOSIS — Z5321 Procedure and treatment not carried out due to patient leaving prior to being seen by health care provider: Secondary | ICD-10-CM | POA: Diagnosis not present

## 2020-07-08 LAB — CBC
HCT: 33.6 % — ABNORMAL LOW (ref 39.0–52.0)
Hemoglobin: 11.5 g/dL — ABNORMAL LOW (ref 13.0–17.0)
MCH: 33.5 pg (ref 26.0–34.0)
MCHC: 34.2 g/dL (ref 30.0–36.0)
MCV: 98 fL (ref 80.0–100.0)
Platelets: 288 10*3/uL (ref 150–400)
RBC: 3.43 MIL/uL — ABNORMAL LOW (ref 4.22–5.81)
RDW: 15.9 % — ABNORMAL HIGH (ref 11.5–15.5)
WBC: 9.5 10*3/uL (ref 4.0–10.5)
nRBC: 0 % (ref 0.0–0.2)

## 2020-07-08 LAB — COMPREHENSIVE METABOLIC PANEL
ALT: 58 U/L — ABNORMAL HIGH (ref 0–44)
AST: 117 U/L — ABNORMAL HIGH (ref 15–41)
Albumin: 2 g/dL — ABNORMAL LOW (ref 3.5–5.0)
Alkaline Phosphatase: 220 U/L — ABNORMAL HIGH (ref 38–126)
Anion gap: 8 (ref 5–15)
BUN: 20 mg/dL (ref 8–23)
CO2: 24 mmol/L (ref 22–32)
Calcium: 8.3 mg/dL — ABNORMAL LOW (ref 8.9–10.3)
Chloride: 104 mmol/L (ref 98–111)
Creatinine, Ser: 1.4 mg/dL — ABNORMAL HIGH (ref 0.61–1.24)
GFR, Estimated: 55 mL/min — ABNORMAL LOW (ref >60)
Glucose, Bld: 130 mg/dL — ABNORMAL HIGH (ref 70–99)
Potassium: 3.9 mmol/L (ref 3.5–5.1)
Sodium: 136 mmol/L (ref 135–145)
Total Bilirubin: 1.7 mg/dL — ABNORMAL HIGH (ref 0.3–1.2)
Total Protein: 6.8 g/dL (ref 6.5–8.1)

## 2020-07-08 LAB — LIPASE, BLOOD: Lipase: 36 U/L (ref 11–51)

## 2020-07-08 LAB — TROPONIN I (HIGH SENSITIVITY): Troponin I (High Sensitivity): 10 ng/L (ref <18)

## 2020-07-08 NOTE — ED Triage Notes (Signed)
First nurse note Pt brought in via ems, pt c/o left upper quad pain that started an hour ago. Denies n/v. Hx of liver failure vss

## 2020-07-08 NOTE — ED Triage Notes (Signed)
Pt comes into the ED via EMS from home with c/o upper abd cramping for the past 1.5hr with nausea, pt has liver CA and is currently getting chemotherapy, last tx 12/7 and gets it q2weeks.

## 2020-07-09 NOTE — ED Notes (Signed)
No answer when called several times from lobby 

## 2020-07-12 ENCOUNTER — Other Ambulatory Visit: Payer: Self-pay

## 2020-07-12 ENCOUNTER — Ambulatory Visit
Admission: RE | Admit: 2020-07-12 | Discharge: 2020-07-12 | Disposition: A | Discharge status: Home / Self Care | Payer: Medicare HMO | Source: Ambulatory Visit | Attending: Interventional Radiology | Admitting: Interventional Radiology

## 2020-07-12 DIAGNOSIS — C220 Liver cell carcinoma: Secondary | ICD-10-CM | POA: Diagnosis present

## 2020-07-12 IMAGING — MR MR ABDOMEN WO/W CM
18 series · 46 of 48 positions shown · IV contrast (7ml Gadavist)
Comparison: CT scan [DATE].

CLINICAL DATA: Cirrhosis with multifocal hepatocellular carcinoma.

EXAM:
MRI ABDOMEN WITHOUT AND WITH CONTRAST
TECHNIQUE: Multiplanar multisequence MR imaging of the abdomen was performed
both before and after the administration of intravenous contrast.
CONTRAST:  7mL GADAVIST GADOBUTROL 1 MMOL/ML IV SOLN

[Series 3: cor haste · coronal · 6.0mm · 1.19mm/px · 1 of 29 slices shown]
[im 1/29]
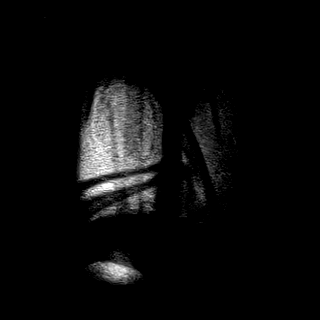

[Series 4: T2 · axial · 6.0mm · 1.19mm/px · 1 of 32 slices shown]
[im 1/32]
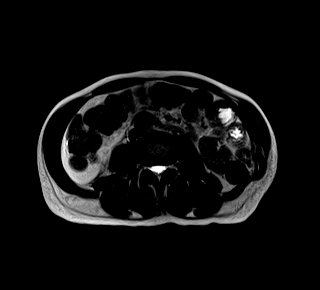

[Series 5: T1 · axial · 6.0mm · 0.74mm/px · 1 of 32 slices shown (1 of 2)]
[im 1/32]
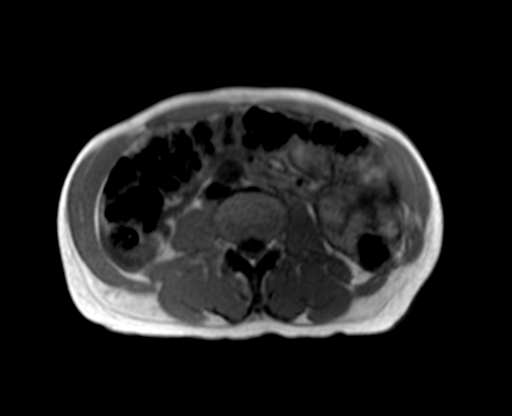

[Series 5: T1 · axial · 6.0mm · 0.74mm/px · 1 of 32 slices shown (2 of 2)]
[im 1/32]
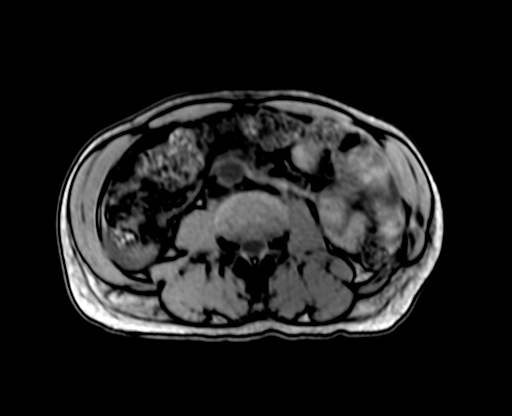

[Series 6: bSSFP · axial · 6.0mm · 0.74mm/px · 1 of 32 slices shown]
[im 1/32]
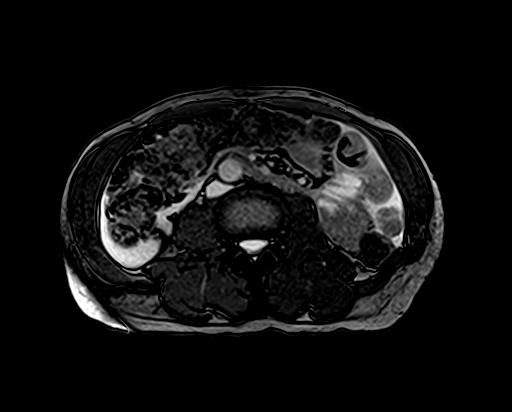

[Series 8: T2 fat-sat · axial · 6.0mm · 1.19mm/px · 1 of 34 slices shown]
[im 1/34]
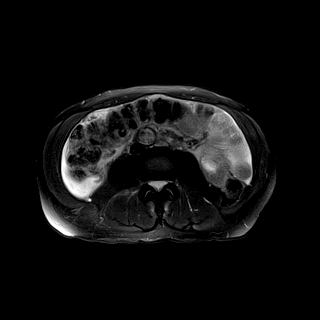

[Series 9: ax dwi_tracew · axial · 6.0mm · 1.42mm/px · z∈[-47,+191]mm · 3 of 102 slices shown]
[im 1/102]
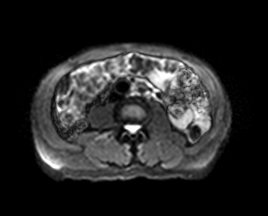
[im 51/102]
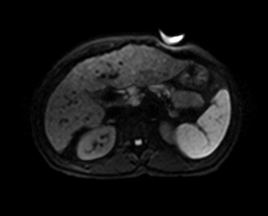
[im 102/102]
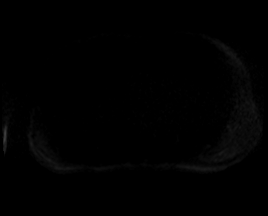

[Series 10: ax dwi_adc · axial · 6.0mm · 1.42mm/px · 1 of 34 slices shown]
[im 1/34]
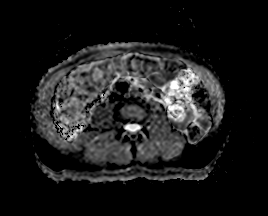

[Series 11: T1 dynamic fat-sat · axial · non-contrast · 3.0mm · 1.19mm/px · z∈[-59,+202]mm · 3 of 88 slices shown (1 of 5)]
[im 1/88]
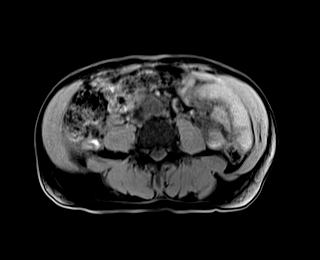
[im 44/88]
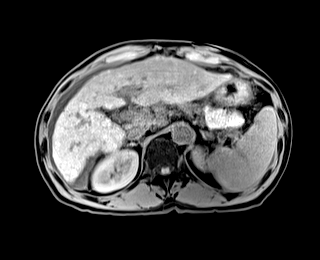
[im 88/88]
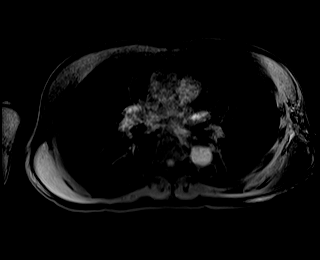

[Series 12: T1 dynamic fat-sat post-contrast · axial · 3.0mm · 1.19mm/px · z∈[-59,+202]mm · 4 of 88 slices shown (1 of 4)]
[im 1/88]
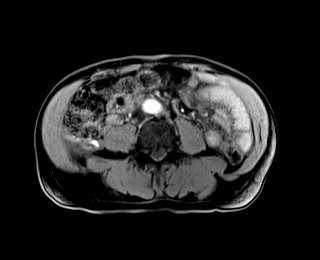
[im 30/88]
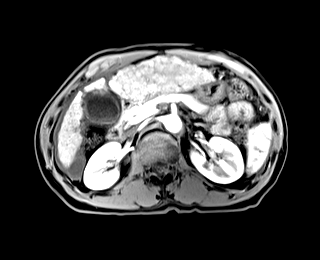
[im 59/88]
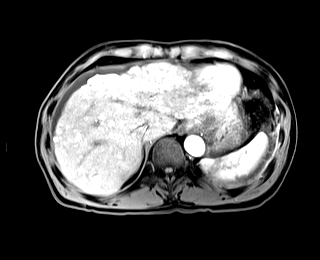
[im 88/88]
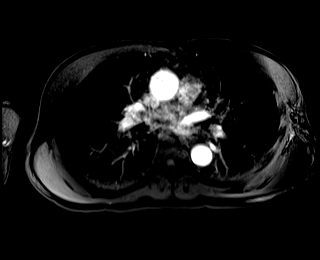

[Series 13: T1 dynamic fat-sat · axial · 3.0mm · 1.19mm/px · z∈[-59,+202]mm · 4 of 88 slices shown (2 of 5)]
[im 1/88]
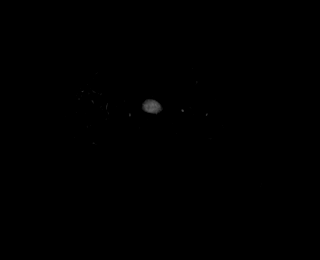
[im 30/88]
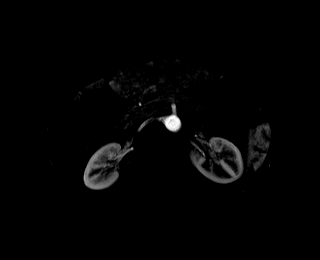
[im 59/88]
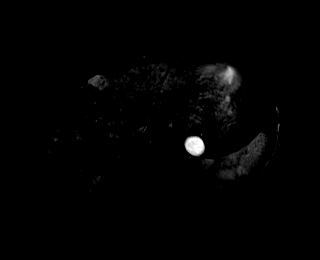
[im 88/88]
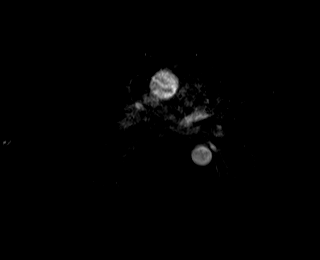

[Series 14: T1 dynamic fat-sat post-contrast · axial · 3.0mm · 1.19mm/px · z∈[-59,+202]mm · 4 of 88 slices shown (2 of 4)]
[im 1/88]
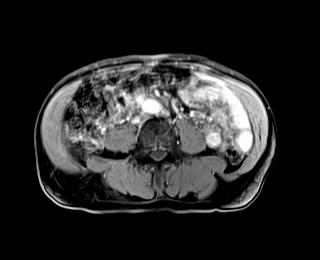
[im 30/88]
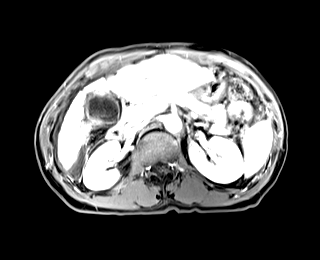
[im 59/88]
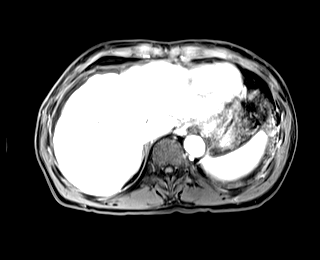
[im 88/88]
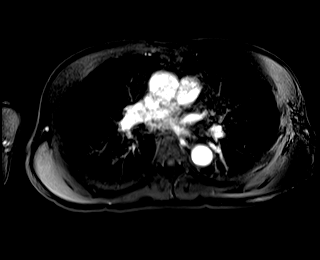

[Series 15: T1 dynamic fat-sat · axial · 3.0mm · 1.19mm/px · z∈[-59,+202]mm · 4 of 88 slices shown (3 of 5)]
[im 1/88]
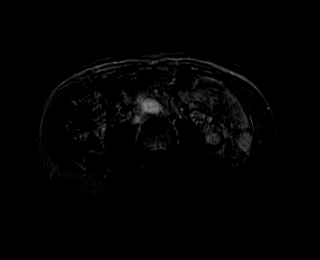
[im 30/88]
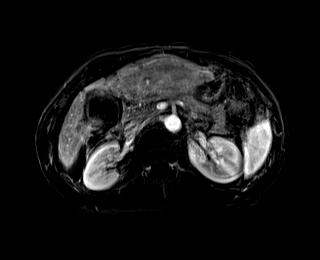
[im 59/88]
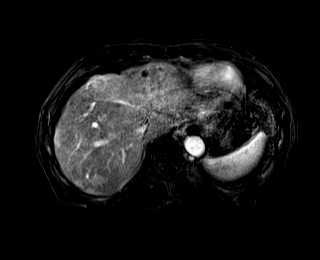
[im 88/88]
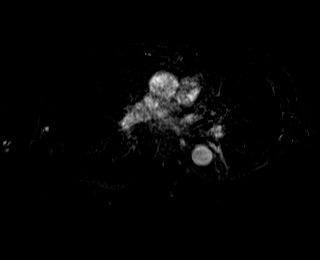

[Series 16: T1 dynamic fat-sat post-contrast · axial · 3.0mm · 1.19mm/px · z∈[-59,+202]mm · 4 of 88 slices shown (3 of 4)]
[im 1/88]
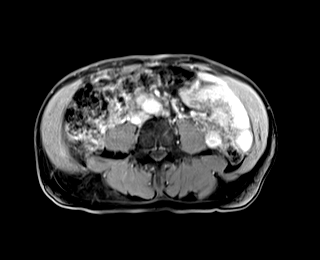
[im 30/88]
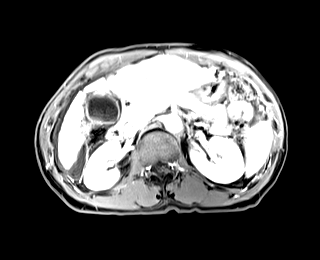
[im 59/88]
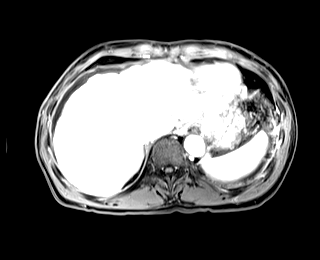
[im 88/88]
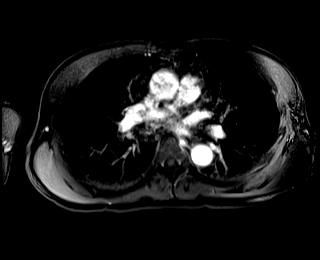

[Series 17: T1 dynamic fat-sat · axial · 3.0mm · 1.19mm/px · z∈[-59,+202]mm · 4 of 88 slices shown (4 of 5)]
[im 1/88]
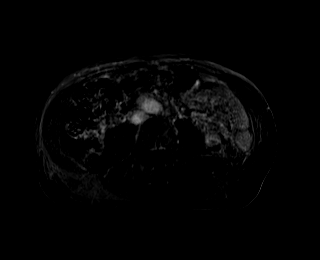
[im 30/88]
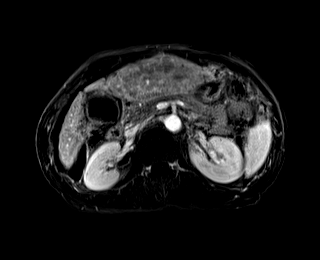
[im 59/88]
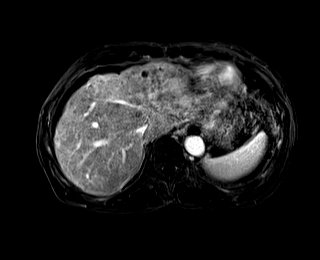
[im 88/88]
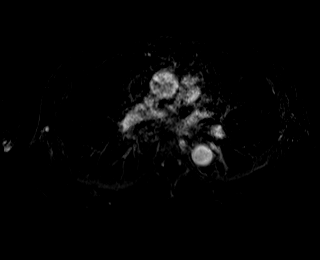

[Series 18: T1 dynamic post-contrast · coronal · 3.0mm · 1.31mm/px · 3 of 72 slices shown]
[im 1/72]
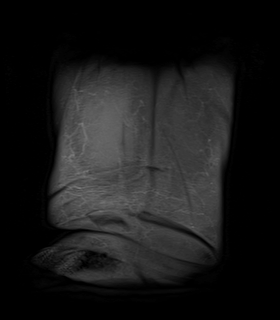
[im 36/72]
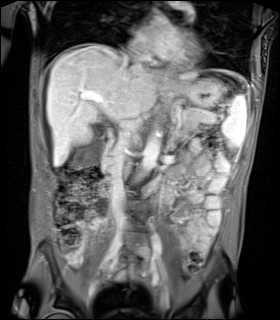
[im 72/72]
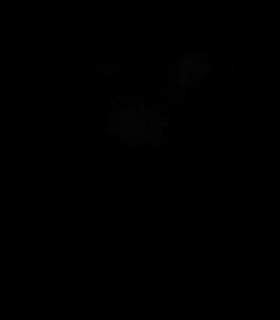

[Series 19: T1 dynamic fat-sat post-contrast · axial · 3.0mm · 1.19mm/px · z∈[-59,+202]mm · 4 of 88 slices shown (4 of 4)]
[im 1/88]
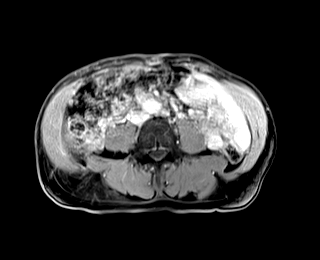
[im 30/88]
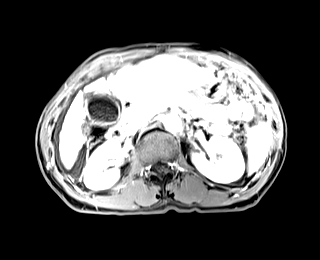
[im 59/88]
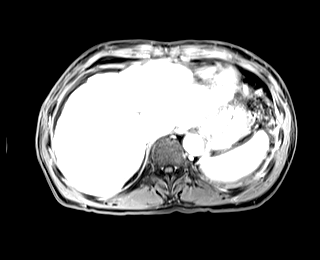
[im 88/88]
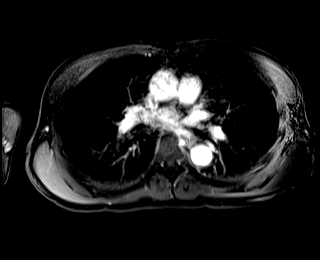

[Series 20: T1 dynamic fat-sat · axial · 3.0mm · 1.19mm/px · z∈[-59,+28]mm · 2 of 88 slices shown (5 of 5)]
[im 1/88]
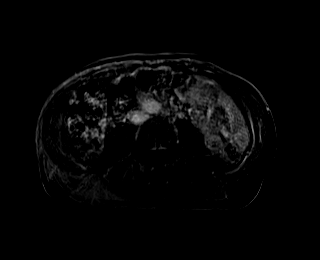
[im 30/88]
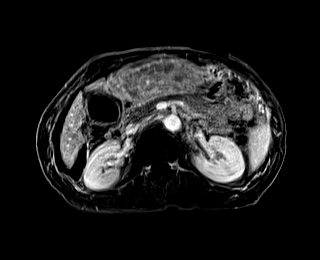

[46 of 48 positions shown; findings below may reference images not displayed]

FINDINGS: Lower chest: Trace right pleural effusion.

Hepatobiliary: Markedly nodular liver is compatible with the known
clinical history of cirrhosis. Innumerable lesions scattered
throughout both hepatic lobes demonstrate arterial phase
hyperenhancement, consistent with multifocal hepatocellular
carcinoma. Index lesion measured on previous CT of [DATE] in the
dome of segment IV at 2.1 cm is stable at 2.1 cm today (image 23 of
arterial phase postcontrast series 12).

9 mm lesion measured previously is not substantially changed at 10
mm today (segment VII on [DATE]).

Inferior left hepatic lobe confluent heterogeneous lesion measured
previously at 9.4 x 4.0 cm is 10.5 x 4.2 cm today (image 58/12).

Exophytic lesion inferolateral segment left liver measures 5.3 x
cm today (69/12) compared to 5.1 x 4.7 cm previously (remeasured).

There are innumerable small (10 mm or less) foci of arterial phase
hyperenhancement throughout the liver on today's study. The number
of foci appears increased comparing back to CT of [DATE], but
this may be related to the better contrast resolution resulting in
increased conspicuity by MRI.

Gallbladder is distended with gallbladder wall
thickening/pericholecystic fluid. No intrahepatic or extrahepatic
biliary dilation.

Pancreas: No focal mass lesion. No dilatation of the main duct. No
intraparenchymal cyst. No peripancreatic edema.

Spleen:  No splenomegaly. No focal mass lesion.

Adrenals/Urinary Tract: No adrenal nodule or mass. Tiny cortical
cyst noted posterior right kidney. Left kidney unremarkable.

Stomach/Bowel: Tiny hiatal hernia. Stomach otherwise unremarkable.
Duodenum is normally positioned as is the ligament of Treitz. No
small bowel or colonic dilatation within the visualized abdomen.

Vascular/Lymphatic: No abdominal aortic aneurysm. Mild
lymphadenopathy noted hepatoduodenal ligament.

Other:  Small volume ascites noted adjacent to the liver.

Musculoskeletal: No focal suspicious marrow enhancement within the
visualized bony anatomy.
IMPRESSION: 1. Innumerable lesions scattered throughout both hepatic lobes
demonstrate arterial phase hyperenhancement, consistent with
multifocal hepatocellular carcinoma. The number of foci appears
increased comparing back to CT of [DATE], but this may be
related to the better contrast resolution on MRI resulting in
increased conspicuity. Follow-up MRI in 3 months could be used to
assess stability. Generally, there is been no substantial change in
size of index lesions in both hepatic lobes.
2. Gallbladder wall thickening/pericholecystic fluid is likely
related to cirrhosis. No evidence for cholelithiasis.
3. Small volume ascites.
4. Mild lymphadenopathy hepatoduodenal ligament, potentially
reactive to underlying liver disease. As metastatic disease cannot
be excluded, attention on follow-up recommended.

## 2020-07-12 MED ORDER — GADOBUTROL 1 MMOL/ML IV SOLN
7.0000 mL | Freq: Once | INTRAVENOUS | Status: AC | PRN
  Administered 2020-07-12: 7 mL via INTRAVENOUS

## 2020-07-14 ENCOUNTER — Other Ambulatory Visit: Payer: Medicare HMO

## 2020-07-16 ENCOUNTER — Other Ambulatory Visit: Payer: Self-pay | Admitting: *Deleted

## 2020-07-16 MED ORDER — OXYCODONE HCL 5 MG PO TABS: 5.0000 mg | ORAL_TABLET | Freq: Four times a day (QID) | ORAL | 0 refills | Status: DC | PRN

## 2020-07-20 NOTE — Progress Notes (Signed)
[place]  [phone] Fax:(336) [phone]  ID: Gaylyn Lambert OB: [birth year]  MR#: [id number]  [id number]  Patient Care Team: Romualdo Bolk, FNP as PCP - General (Nurse Practitioner) Clent Jacks, RN as Oncology Nurse Navigator  CHIEF COMPLAINT: Stage IIIa [facility]  INTERVAL HISTORY: Patient returns to clinic today for further evaluation and consideration of cycle 4 of single agent Tecentriq.  He was recently evaluated by interventional radiology and plans to have 3 separate Y 90 ablations in the near future.  He has intermittent abdominal pain, but otherwise feels well. His appetite has improved and his weight is stable. He continues to have right leg weakness.  He has no other neurologic complaints. He denies any recent fevers or illnesses.  He has no chest pain, shortness of breath, cough, or hemoptysis. He denies any abdominal pain. He denies any nausea, vomiting, constipation, or diarrhea.  He has occasional urinary retention.  Patient offers no further specific complaints today.  REVIEW OF SYSTEMS:   Review of Systems  Constitutional: Positive for malaise/fatigue. Negative for fever and weight loss.  Respiratory: Negative.  Negative for cough and shortness of breath.   Cardiovascular: Negative.  Negative for chest pain and leg swelling.  Gastrointestinal: Positive for abdominal pain. Negative for blood in stool, constipation, nausea and vomiting.  Genitourinary: Negative.  Negative for flank pain.  Musculoskeletal: Negative.  Negative for back pain.  Skin: Negative.  Negative for rash.  Neurological: Positive for focal weakness and weakness. Negative for dizziness and headaches.  Psychiatric/Behavioral: Negative.  The patient is not nervous/anxious.     As per HPI. Otherwise, a complete review of systems is negative.  PAST MEDICAL HISTORY: Past Medical History:  Diagnosis Date  . Cancer (HCC)    liver/LUNG  . Headache    almost  completely unbearable.  only time improves this  . Hepatitis A 04/2020   C+  . Hypertension   . Stroke [facility]) 2010   right sided weakness remains    PAST SURGICAL HISTORY: Past Surgical History:  Procedure Laterality Date  . ARTERY BIOPSY Right 06/13/2020   Procedure: BIOPSY TEMPORAL ARTERY;  Surgeon: Algernon Huxley, MD;  Location: [facility] ORS;  Service: Vascular;  Laterality: Right;  . IR IMAGING GUIDED PORT INSERTION  05/02/2020  . IR RADIOLOGIST EVAL & MGMT  07/03/2020    FAMILY HISTORY: Family History  Problem Relation Age of Onset  . Aneurysm Mother   . Diabetes Father     ADVANCED DIRECTIVES (Y/N):  N  HEALTH MAINTENANCE: Social History   Tobacco Use  . Smoking status: Former Smoker    Packs/day: 0.50    Years: 40.00    Pack years: 20.00    Types: Cigarettes  . Smokeless tobacco: Never Used  . Tobacco comment: has not smoked in one month  Vaping Use  . Vaping Use: Never used  Substance Use Topics  . Alcohol use: Not Currently  . Drug use: Not Currently    Comment: PTS SISTER Sterling HE HAS USED DRUGS IN THE PAST-UNSURE WHICH DRUGS WERE USED AND HOW LONG AGO     Colonoscopy:  PAP:  Bone density:  Lipid panel:  No Known Allergies  Current Outpatient Medications  Medication Sig Dispense Refill  . buPROPion (WELLBUTRIN SR) 150 MG 12 hr tablet Take 1 tablet by mouth in the morning and at bedtime.    . capsicum (ZOSTRIX) 0.075 % topical cream Apply 1 application topically every 8 (eight) hours.     Marland Kitchen  lisinopril-hydrochlorothiazide (ZESTORETIC) 20-12.5 MG tablet Take 1 tablet by mouth daily.    . prochlorperazine (COMPAZINE) 10 MG tablet Take 1 tablet (10 mg total) by mouth every 6 (six) hours as needed (Nausea or vomiting). 60 tablet 2  . lidocaine-prilocaine (EMLA) cream Apply to affected area once 30 g 3  . Oxycodone HCl 10 MG TABS Take 1 tablet (10 mg total) by mouth 4 (four) times daily as needed. 90 tablet 0   No current facility-administered  medications for this visit.    OBJECTIVE: Vitals:   07/23/20 0924  BP: (!) 139/95  Pulse: 85  Resp: 18  Temp: 98.7 F (37.1 C)  SpO2: 100%     Body mass index is 19.45 kg/m.    ECOG FS:1 - Symptomatic but completely ambulatory  General: Well-developed, well-nourished, no acute distress. Eyes: Pink conjunctiva, anicteric sclera. HEENT: Normocephalic, moist mucous membranes. Lungs: No audible wheezing or coughing. Heart: Regular rate and rhythm. Abdomen: Soft, nontender, no obvious distention. Musculoskeletal: No edema, cyanosis, or clubbing. Neuro: Alert, answering all questions appropriately. Cranial nerves grossly intact. Skin: No rashes or petechiae noted. Psych: Normal affect.  LAB RESULTS:  Lab Results  Component Value Date   NA 136 07/23/2020   K 4.0 07/23/2020   CL 106 07/23/2020   CO2 25 07/23/2020   GLUCOSE 78 07/23/2020   BUN 13 07/23/2020   CREATININE 0.98 07/23/2020   CALCIUM 8.1 (L) 07/23/2020   PROT 6.8 07/23/2020   ALBUMIN 2.0 (L) 07/23/2020   AST 123 (H) 07/23/2020   ALT 52 (H) 07/23/2020   ALKPHOS 197 (H) 07/23/2020   BILITOT 1.4 (H) 07/23/2020   GFRNONAA >60 07/23/2020   GFRAA >60 04/04/2020    Lab Results  Component Value Date   WBC 5.3 07/23/2020   NEUTROABS 2.6 07/23/2020   HGB 11.5 (L) 07/23/2020   HCT 32.6 (L) 07/23/2020   MCV 97.3 07/23/2020   PLT 184 07/23/2020     STUDIES: MR ABDOMEN WWO CONTRAST  Result Date: 07/14/2020 CLINICAL DATA:  Cirrhosis with multifocal hepatocellular carcinoma. EXAM: MRI ABDOMEN WITHOUT AND WITH CONTRAST TECHNIQUE: Multiplanar multisequence MR imaging of the abdomen was performed both before and after the administration of intravenous contrast. CONTRAST:  48mL GADAVIST GADOBUTROL 1 MMOL/ML IV SOLN COMPARISON:  CT scan 04/01/2020. FINDINGS: Lower chest: Trace right pleural effusion. Hepatobiliary: Markedly nodular liver is compatible with the known clinical history of cirrhosis. Innumerable lesions  scattered throughout both hepatic lobes demonstrate arterial phase hyperenhancement, consistent with multifocal hepatocellular carcinoma. Index lesion measured on previous CT of 04/01/2020 in the dome of segment IV at 2.1 cm is stable at 2.1 cm today (image 23 of arterial phase postcontrast series 12). 9 mm lesion measured previously is not substantially changed at 10 mm today (segment VII on 26/12). Inferior left hepatic lobe confluent heterogeneous lesion measured previously at 9.4 x 4.0 cm is 10.5 x 4.2 cm today (image 58/12). Exophytic lesion inferolateral segment left liver measures 5.3 x 4.7 cm today (69/12) compared to 5.1 x 4.7 cm previously (remeasured). There are innumerable small (10 mm or less) foci of arterial phase hyperenhancement throughout the liver on today's study. The number of foci appears increased comparing back to CT of 04/01/2020, but this may be related to the better contrast resolution resulting in increased conspicuity by MRI. Gallbladder is distended with gallbladder wall thickening/pericholecystic fluid. No intrahepatic or extrahepatic biliary dilation. Pancreas: No focal mass lesion. No dilatation of the main duct. No intraparenchymal cyst. No peripancreatic edema. Spleen:  No splenomegaly. No focal mass lesion. Adrenals/Urinary Tract: No adrenal nodule or mass. Tiny cortical cyst noted posterior right kidney. Left kidney unremarkable. Stomach/Bowel: Tiny hiatal hernia. Stomach otherwise unremarkable. Duodenum is normally positioned as is the ligament of Treitz. No small bowel or colonic dilatation within the visualized abdomen. Vascular/Lymphatic: No abdominal aortic aneurysm. Mild lymphadenopathy noted hepatoduodenal ligament. Other:  Small volume ascites noted adjacent to the liver. Musculoskeletal: No focal suspicious marrow enhancement within the visualized bony anatomy. IMPRESSION: 1. Innumerable lesions scattered throughout both hepatic lobes demonstrate arterial phase  hyperenhancement, consistent with multifocal hepatocellular carcinoma. The number of foci appears increased comparing back to CT of 04/01/2020, but this may be related to the better contrast resolution on MRI resulting in increased conspicuity. Follow-up MRI in 3 months could be used to assess stability. Generally, there is been no substantial change in size of index lesions in both hepatic lobes. 2. Gallbladder wall thickening/pericholecystic fluid is likely related to cirrhosis. No evidence for cholelithiasis. 3. Small volume ascites. 4. Mild lymphadenopathy hepatoduodenal ligament, potentially reactive to underlying liver disease. As metastatic disease cannot be excluded, attention on follow-up recommended. Electronically Signed   By: Misty Stanley M.D.   On: 07/14/2020 09:02   IR Radiologist Eval & Mgmt  Result Date: 07/03/2020 Please refer to notes tab for details about interventional procedure. (Op Note)   ASSESSMENT: Stage IIIa HCC  PLAN:    1. Stage IIIa HCC: Although patient's AFP is only minimally elevated at 12.1, biopsy confirmed hepatocellular carcinoma.  PET scan results reviewed independently confirming stage of disease.  His most recent MRI ordered by interventional radiology on July 12, 2020 essentially revealed stable disease.  We will continue with single agent Tecentriq until after his ablation and then can add in Avastin at that time.  Proceed with cycle 4 today.  Return to clinic in 3 weeks for further evaluation and consideration of cycle 5.   2. Renal insufficiency: Resolved. 3. Elevated liver enzymes: Chronic and unchanged.  Patient's AST and ALT are 123 and 52 respectively.  Proceed with treatment as above. 4. Pain: Continue oxycodone as needed.  Patient was given a prescription today. 5. Weight loss: Improving. 6.  Ablation: Initiate interventional radiology input.  Plan is to do 3 separate Y 90 ablations in the near future.    Patient expressed understanding and  was in agreement with this plan. He also understands that He can call clinic at any time with any questions, concerns, or complaints.   Cancer Staging Hepatocellular carcinoma [facility]) Staging form: Liver, AJCC 8th Edition - Clinical stage from 05/01/2020: Stage IIIA (cT3, cN0, cM0) - Signed by Lloyd Huger, MD on 05/01/2020   Lloyd Huger, MD   07/23/2020 1:00 PM

## 2020-07-23 ENCOUNTER — Other Ambulatory Visit: Payer: Self-pay | Admitting: *Deleted

## 2020-07-23 ENCOUNTER — Inpatient Hospital Stay: Payer: Medicare HMO

## 2020-07-23 ENCOUNTER — Encounter: Payer: Self-pay | Admitting: Oncology

## 2020-07-23 ENCOUNTER — Inpatient Hospital Stay (HOSPITAL_BASED_OUTPATIENT_CLINIC_OR_DEPARTMENT_OTHER): Payer: Medicare HMO | Admitting: Oncology

## 2020-07-23 VITALS — BP 139/95 | HR 85 | Temp 98.7°F | Resp 18 | Wt 147.4 lb

## 2020-07-23 DIAGNOSIS — C220 Liver cell carcinoma: Secondary | ICD-10-CM

## 2020-07-23 DIAGNOSIS — Z5111 Encounter for antineoplastic chemotherapy: Secondary | ICD-10-CM | POA: Diagnosis not present

## 2020-07-23 LAB — CBC WITH DIFFERENTIAL/PLATELET
Abs Immature Granulocytes: 0.03 10*3/uL (ref 0.00–0.07)
Basophils Absolute: 0 10*3/uL (ref 0.0–0.1)
Basophils Relative: 0 %
Eosinophils Absolute: 0.3 10*3/uL (ref 0.0–0.5)
Eosinophils Relative: 6 %
HCT: 32.6 % — ABNORMAL LOW (ref 39.0–52.0)
Hemoglobin: 11.5 g/dL — ABNORMAL LOW (ref 13.0–17.0)
Immature Granulocytes: 1 %
Lymphocytes Relative: 33 %
Lymphs Abs: 1.8 10*3/uL (ref 0.7–4.0)
MCH: 34.3 pg — ABNORMAL HIGH (ref 26.0–34.0)
MCHC: 35.3 g/dL (ref 30.0–36.0)
MCV: 97.3 fL (ref 80.0–100.0)
Monocytes Absolute: 0.6 10*3/uL (ref 0.1–1.0)
Monocytes Relative: 12 %
Neutro Abs: 2.6 10*3/uL (ref 1.7–7.7)
Neutrophils Relative %: 48 %
Platelets: 184 10*3/uL (ref 150–400)
RBC: 3.35 MIL/uL — ABNORMAL LOW (ref 4.22–5.81)
RDW: 16.2 % — ABNORMAL HIGH (ref 11.5–15.5)
WBC: 5.3 10*3/uL (ref 4.0–10.5)
nRBC: 0 % (ref 0.0–0.2)

## 2020-07-23 LAB — COMPREHENSIVE METABOLIC PANEL
ALT: 52 U/L — ABNORMAL HIGH (ref 0–44)
AST: 123 U/L — ABNORMAL HIGH (ref 15–41)
Albumin: 2 g/dL — ABNORMAL LOW (ref 3.5–5.0)
Alkaline Phosphatase: 197 U/L — ABNORMAL HIGH (ref 38–126)
Anion gap: 5 (ref 5–15)
BUN: 13 mg/dL (ref 8–23)
CO2: 25 mmol/L (ref 22–32)
Calcium: 8.1 mg/dL — ABNORMAL LOW (ref 8.9–10.3)
Chloride: 106 mmol/L (ref 98–111)
Creatinine, Ser: 0.98 mg/dL (ref 0.61–1.24)
GFR, Estimated: >60 mL/min (ref >60)
Glucose, Bld: 78 mg/dL (ref 70–99)
Potassium: 4 mmol/L (ref 3.5–5.1)
Sodium: 136 mmol/L (ref 135–145)
Total Bilirubin: 1.4 mg/dL — ABNORMAL HIGH (ref 0.3–1.2)
Total Protein: 6.8 g/dL (ref 6.5–8.1)

## 2020-07-23 LAB — TSH: TSH: 3.967 u[IU]/mL (ref 0.350–4.500)

## 2020-07-23 MED ORDER — HEPARIN SOD (PORK) LOCK FLUSH 100 UNIT/ML IV SOLN
INTRAVENOUS | Status: AC
  Filled 2020-07-23: qty 5

## 2020-07-23 MED ORDER — SODIUM CHLORIDE 0.9 % IV SOLN
Freq: Once | INTRAVENOUS | Status: AC
  Filled 2020-07-23: qty 250

## 2020-07-23 MED ORDER — OXYCODONE HCL 10 MG PO TABS: 10.0000 mg | ORAL_TABLET | Freq: Four times a day (QID) | ORAL | 0 refills | Status: DC | PRN

## 2020-07-23 MED ORDER — SODIUM CHLORIDE 0.9 % IV SOLN
1200.0000 mg | Freq: Once | INTRAVENOUS | Status: AC
  Administered 2020-07-23: 1200 mg via INTRAVENOUS
  Filled 2020-07-23: qty 20

## 2020-07-23 MED ORDER — HEPARIN SOD (PORK) LOCK FLUSH 100 UNIT/ML IV SOLN
500.0000 [IU] | Freq: Once | INTRAVENOUS | Status: AC | PRN
  Administered 2020-07-23: 500 [IU]
  Filled 2020-07-23: qty 5

## 2020-07-23 NOTE — Progress Notes (Signed)
Stable at discharge 

## 2020-07-24 LAB — T4: T4, Total: 8.7 ug/dL (ref 4.5–12.0)

## 2020-08-05 ENCOUNTER — Other Ambulatory Visit (HOSPITAL_COMMUNITY): Payer: Self-pay | Admitting: Interventional Radiology

## 2020-08-05 DIAGNOSIS — C220 Liver cell carcinoma: Secondary | ICD-10-CM

## 2020-08-09 NOTE — Progress Notes (Signed)
[place]  Telephone:(336) [phone] Fax:(336) [phone]  ID: Joshua Ramos OB: 1950/12/29  MR#: [id number]  [id number]  Patient Care Team: Romualdo Bolk, FNP as PCP - General (Nurse Practitioner) Clent Jacks, RN as Oncology Nurse Navigator  CHIEF COMPLAINT: Stage IIIa [facility]  INTERVAL HISTORY: Patient returns to clinic today for further evaluation and consideration of cycle 5 of single agent Tecentriq.  He continues to have intermittent abdominal pain.  He has noticed increased fatigue and difficulty sleeping at night. His appetite has improved and his weight is stable. He continues to have right leg weakness.  He has no other neurologic complaints. He denies any recent fevers or illnesses.  He has no chest pain, shortness of breath, cough, or hemoptysis. He denies any abdominal pain. He denies any nausea, vomiting, constipation, or diarrhea.  He has no urinary complaints.  Patient offers no further specific complaints today.  REVIEW OF SYSTEMS:   Review of Systems  Constitutional: Positive for malaise/fatigue. Negative for fever and weight loss.  Respiratory: Negative.  Negative for cough and shortness of breath.   Cardiovascular: Negative.  Negative for chest pain and leg swelling.  Gastrointestinal: Positive for abdominal pain. Negative for blood in stool, constipation, nausea and vomiting.  Genitourinary: Negative.  Negative for flank pain.  Musculoskeletal: Negative.  Negative for back pain.  Skin: Negative.  Negative for rash.  Neurological: Positive for focal weakness and weakness. Negative for dizziness and headaches.  Psychiatric/Behavioral: The patient has insomnia. The patient is not nervous/anxious.     As per HPI. Otherwise, a complete review of systems is negative.  PAST MEDICAL HISTORY: Past Medical History:  Diagnosis Date  . Cancer (HCC)    liver/LUNG  . Headache    almost completely unbearable.  only time improves this  .  Hepatitis A 04/2020   C+  . Hypertension   . Stroke [facility]) 2010   right sided weakness remains    PAST SURGICAL HISTORY: Past Surgical History:  Procedure Laterality Date  . ARTERY BIOPSY Right 06/13/2020   Procedure: BIOPSY TEMPORAL ARTERY;  Surgeon: Algernon Huxley, MD;  Location: [facility] ORS;  Service: Vascular;  Laterality: Right;  . IR IMAGING GUIDED PORT INSERTION  05/02/2020  . IR RADIOLOGIST EVAL & MGMT  07/03/2020    FAMILY HISTORY: Family History  Problem Relation Age of Onset  . Aneurysm Mother   . Diabetes Father     ADVANCED DIRECTIVES (Y/N):  N  HEALTH MAINTENANCE: Social History   Tobacco Use  . Smoking status: Former Smoker    Packs/day: 0.50    Years: 40.00    Pack years: 20.00    Types: Cigarettes  . Smokeless tobacco: Never Used  . Tobacco comment: has not smoked in one month  Vaping Use  . Vaping Use: Never used  Substance Use Topics  . Alcohol use: Not Currently  . Drug use: Not Currently    Comment: PTS SISTER [place] HE HAS USED DRUGS IN THE PAST-UNSURE WHICH DRUGS WERE USED AND HOW LONG AGO     Colonoscopy:  PAP:  Bone density:  Lipid panel:  No Known Allergies  Current Outpatient Medications  Medication Sig Dispense Refill  . ALPRAZolam (XANAX) 0.25 MG tablet Take 1 tablet (0.25 mg total) by mouth at bedtime as needed (for sleep). 30 tablet 0  . buPROPion (WELLBUTRIN SR) 150 MG 12 hr tablet Take 1 tablet by mouth in the morning and at bedtime.    . capsicum (ZOSTRIX)  0.075 % topical cream Apply 1 application topically every 8 (eight) hours.     . lidocaine-prilocaine (EMLA) cream Apply to affected area once 30 g 3  . lisinopril-hydrochlorothiazide (ZESTORETIC) 20-12.5 MG tablet Take 1 tablet by mouth daily.    . prochlorperazine (COMPAZINE) 10 MG tablet Take 1 tablet (10 mg total) by mouth every 6 (six) hours as needed (Nausea or vomiting). 60 tablet 2  . Oxycodone HCl 10 MG TABS Take 1 tablet (10 mg total) by mouth 4 (four) times daily  as needed. 90 tablet 0   No current facility-administered medications for this visit.   Facility-Administered Medications Ordered in Other Visits  Medication Dose Route Frequency Provider Last Rate Last Admin  . atezolizumab (TECENTRIQ) 1,200 mg in sodium chloride 0.9 % 250 mL chemo infusion  1,200 mg Intravenous Once Lloyd Huger, MD 540 mL/hr at 08/13/20 1128 1,200 mg at 08/13/20 1128  . heparin lock flush 100 unit/mL  500 Units Intracatheter Once PRN Lloyd Huger, MD        OBJECTIVE: Vitals:   08/13/20 1010  BP: 107/72  Pulse: 84  Resp: 18  Temp: 98 F (36.7 C)  SpO2: 98%     Body mass index is 20.42 kg/m.    ECOG FS:1 - Symptomatic but completely ambulatory  General: Well-developed, well-nourished, no acute distress. Eyes: Pink conjunctiva, anicteric sclera. HEENT: Normocephalic, moist mucous membranes. Lungs: No audible wheezing or coughing. Heart: Regular rate and rhythm. Abdomen: Soft, nontender, no obvious distention. Musculoskeletal: No edema, cyanosis, or clubbing. Neuro: Alert, answering all questions appropriately. Cranial nerves grossly intact. Skin: No rashes or petechiae noted. Psych: Normal affect.  LAB RESULTS:  Lab Results  Component Value Date   NA 134 (L) 08/13/2020   K 3.7 08/13/2020   CL 102 08/13/2020   CO2 25 08/13/2020   GLUCOSE 138 (H) 08/13/2020   BUN 17 08/13/2020   CREATININE 1.16 08/13/2020   CALCIUM 7.8 (L) 08/13/2020   PROT 6.6 08/13/2020   ALBUMIN 1.7 (L) 08/13/2020   AST 96 (H) 08/13/2020   ALT 39 08/13/2020   ALKPHOS 226 (H) 08/13/2020   BILITOT 1.9 (H) 08/13/2020   GFRNONAA >60 08/13/2020   GFRAA >60 04/04/2020    Lab Results  Component Value Date   WBC 6.2 08/13/2020   NEUTROABS 3.8 08/13/2020   HGB 11.0 (L) 08/13/2020   HCT 30.9 (L) 08/13/2020   MCV 95.7 08/13/2020   PLT 196 08/13/2020     STUDIES: No results found.  ASSESSMENT: Stage IIIa HCC  PLAN:    1. Stage IIIa HCC: Although patient's  AFP is only minimally elevated at 12.1, biopsy confirmed hepatocellular carcinoma.  PET scan results reviewed independently confirming stage of disease.  His most recent MRI ordered by interventional radiology on July 12, 2020 essentially revealed stable disease.  We will continue with single agent Tecentriq until after his ablation and then can add in Avastin at that time.  Proceed with cycle 5 today.  Return to clinic in 3 weeks for further evaluation and consideration of cycle 6.  2. Renal insufficiency: Resolved. 3. Elevated liver enzymes: Mildly improved.  Patient's AST and ALT are 96 and 39 respectively.  Proceed with treatment as above. 4.  Hyperbilirubinemia: Bilirubin has trended up slightly to 1.9, proceed with treatment as above. 5.  Pain: Continue oxycodone as needed.  Patient was given a refill today. 6.  Insomnia: Patient was given a prescription for Xanax today. 7.  Weight loss: Improving. 8.  Y 90 ablation: Appreciate interventional radiology input.  Patient is scheduled for August 18, 2020, September 02, 2020, and September 30, 2020.      Patient expressed understanding and was in agreement with this plan. He also understands that He can call clinic at any time with any questions, concerns, or complaints.   Cancer Staging Hepatocellular carcinoma [facility]) Staging form: Liver, AJCC 8th Edition - Clinical stage from 05/01/2020: Stage IIIA (cT3, cN0, cM0) - Signed by Lloyd Huger, MD on 05/01/2020   Lloyd Huger, MD   08/13/2020 11:46 AM

## 2020-08-13 ENCOUNTER — Inpatient Hospital Stay: Payer: Medicare HMO

## 2020-08-13 ENCOUNTER — Inpatient Hospital Stay (HOSPITAL_BASED_OUTPATIENT_CLINIC_OR_DEPARTMENT_OTHER): Payer: Medicare HMO | Admitting: Oncology

## 2020-08-13 ENCOUNTER — Inpatient Hospital Stay: Payer: Medicare HMO | Attending: Oncology

## 2020-08-13 ENCOUNTER — Encounter: Payer: Self-pay | Admitting: Oncology

## 2020-08-13 VITALS — BP 107/72 | HR 84 | Temp 98.0°F | Resp 18 | Wt 154.8 lb

## 2020-08-13 DIAGNOSIS — C220 Liver cell carcinoma: Secondary | ICD-10-CM

## 2020-08-13 DIAGNOSIS — Z87891 Personal history of nicotine dependence: Secondary | ICD-10-CM | POA: Insufficient documentation

## 2020-08-13 DIAGNOSIS — G4700 Insomnia, unspecified: Secondary | ICD-10-CM | POA: Diagnosis not present

## 2020-08-13 DIAGNOSIS — R748 Abnormal levels of other serum enzymes: Secondary | ICD-10-CM | POA: Diagnosis not present

## 2020-08-13 DIAGNOSIS — Z79899 Other long term (current) drug therapy: Secondary | ICD-10-CM | POA: Diagnosis not present

## 2020-08-13 LAB — CBC WITH DIFFERENTIAL/PLATELET
Abs Immature Granulocytes: 0.04 10*3/uL (ref 0.00–0.07)
Basophils Absolute: 0.1 10*3/uL (ref 0.0–0.1)
Basophils Relative: 1 %
Eosinophils Absolute: 0.4 10*3/uL (ref 0.0–0.5)
Eosinophils Relative: 6 %
HCT: 30.9 % — ABNORMAL LOW (ref 39.0–52.0)
Hemoglobin: 11 g/dL — ABNORMAL LOW (ref 13.0–17.0)
Immature Granulocytes: 1 %
Lymphocytes Relative: 23 %
Lymphs Abs: 1.4 10*3/uL (ref 0.7–4.0)
MCH: 34.1 pg — ABNORMAL HIGH (ref 26.0–34.0)
MCHC: 35.6 g/dL (ref 30.0–36.0)
MCV: 95.7 fL (ref 80.0–100.0)
Monocytes Absolute: 0.6 10*3/uL (ref 0.1–1.0)
Monocytes Relative: 10 %
Neutro Abs: 3.8 10*3/uL (ref 1.7–7.7)
Neutrophils Relative %: 59 %
Platelets: 196 10*3/uL (ref 150–400)
RBC: 3.23 MIL/uL — ABNORMAL LOW (ref 4.22–5.81)
RDW: 16.3 % — ABNORMAL HIGH (ref 11.5–15.5)
WBC: 6.2 10*3/uL (ref 4.0–10.5)
nRBC: 0 % (ref 0.0–0.2)

## 2020-08-13 LAB — COMPREHENSIVE METABOLIC PANEL
ALT: 39 U/L (ref 0–44)
AST: 96 U/L — ABNORMAL HIGH (ref 15–41)
Albumin: 1.7 g/dL — ABNORMAL LOW (ref 3.5–5.0)
Alkaline Phosphatase: 226 U/L — ABNORMAL HIGH (ref 38–126)
Anion gap: 7 (ref 5–15)
BUN: 17 mg/dL (ref 8–23)
CO2: 25 mmol/L (ref 22–32)
Calcium: 7.8 mg/dL — ABNORMAL LOW (ref 8.9–10.3)
Chloride: 102 mmol/L (ref 98–111)
Creatinine, Ser: 1.16 mg/dL (ref 0.61–1.24)
GFR, Estimated: >60 mL/min (ref >60)
Glucose, Bld: 138 mg/dL — ABNORMAL HIGH (ref 70–99)
Potassium: 3.7 mmol/L (ref 3.5–5.1)
Sodium: 134 mmol/L — ABNORMAL LOW (ref 135–145)
Total Bilirubin: 1.9 mg/dL — ABNORMAL HIGH (ref 0.3–1.2)
Total Protein: 6.6 g/dL (ref 6.5–8.1)

## 2020-08-13 LAB — TSH: TSH: 3.789 u[IU]/mL (ref 0.350–4.500)

## 2020-08-13 MED ORDER — HEPARIN SOD (PORK) LOCK FLUSH 100 UNIT/ML IV SOLN
INTRAVENOUS | Status: AC
  Filled 2020-08-13: qty 5

## 2020-08-13 MED ORDER — HEPARIN SOD (PORK) LOCK FLUSH 100 UNIT/ML IV SOLN
500.0000 [IU] | Freq: Once | INTRAVENOUS | Status: AC | PRN
  Administered 2020-08-13: 500 [IU]
  Filled 2020-08-13: qty 5

## 2020-08-13 MED ORDER — SODIUM CHLORIDE 0.9 % IV SOLN
Freq: Once | INTRAVENOUS | Status: AC
  Filled 2020-08-13: qty 250

## 2020-08-13 MED ORDER — SODIUM CHLORIDE 0.9 % IV SOLN
1200.0000 mg | Freq: Once | INTRAVENOUS | Status: AC
  Administered 2020-08-13: 1200 mg via INTRAVENOUS
  Filled 2020-08-13: qty 20

## 2020-08-13 MED ORDER — ALPRAZOLAM 0.25 MG PO TABS: 0.2500 mg | ORAL_TABLET | Freq: Every evening | ORAL | 0 refills | Status: DC | PRN

## 2020-08-13 MED ORDER — OXYCODONE HCL 10 MG PO TABS: 10.0000 mg | ORAL_TABLET | Freq: Four times a day (QID) | ORAL | 0 refills | Status: DC | PRN

## 2020-08-13 NOTE — Progress Notes (Signed)
MD reviewed labs and ok to proceed with tx today 

## 2020-08-13 NOTE — Progress Notes (Signed)
Pt in for follow up, reports improved appetite.  Pt has had 7 lb weight gain in 07/23/20.  Pt does report increased fatigue.

## 2020-08-14 LAB — T4: T4, Total: 7.9 ug/dL (ref 4.5–12.0)

## 2020-08-15 ENCOUNTER — Other Ambulatory Visit: Payer: Self-pay | Admitting: Radiology

## 2020-08-18 ENCOUNTER — Encounter (HOSPITAL_COMMUNITY)
Admission: RE | Admit: 2020-08-18 | Discharge: 2020-08-18 | Disposition: A | Discharge status: Home / Self Care | Payer: Medicare HMO | Source: Ambulatory Visit | Attending: Interventional Radiology | Admitting: Interventional Radiology

## 2020-08-18 ENCOUNTER — Ambulatory Visit (HOSPITAL_COMMUNITY)
Admission: RE | Admit: 2020-08-18 | Discharge: 2020-08-18 | Disposition: A | Discharge status: Home / Self Care | Payer: Medicare HMO | Source: Ambulatory Visit

## 2020-08-18 ENCOUNTER — Encounter (HOSPITAL_COMMUNITY): Payer: Self-pay

## 2020-08-18 ENCOUNTER — Ambulatory Visit (HOSPITAL_COMMUNITY)
Admission: RE | Admit: 2020-08-18 | Discharge: 2020-08-18 | Disposition: A | Discharge status: Home / Self Care | Payer: Medicare HMO | Source: Ambulatory Visit | Attending: Interventional Radiology | Admitting: Interventional Radiology

## 2020-08-18 ENCOUNTER — Other Ambulatory Visit: Payer: Self-pay

## 2020-08-18 DIAGNOSIS — Z539 Procedure and treatment not carried out, unspecified reason: Secondary | ICD-10-CM | POA: Diagnosis not present

## 2020-08-18 DIAGNOSIS — C220 Liver cell carcinoma: Secondary | ICD-10-CM

## 2020-08-18 LAB — COMPREHENSIVE METABOLIC PANEL
ALT: 42 U/L (ref 0–44)
AST: 115 U/L — ABNORMAL HIGH (ref 15–41)
Albumin: 1.8 g/dL — ABNORMAL LOW (ref 3.5–5.0)
Alkaline Phosphatase: 187 U/L — ABNORMAL HIGH (ref 38–126)
Anion gap: 7 (ref 5–15)
BUN: 22 mg/dL (ref 8–23)
CO2: 23 mmol/L (ref 22–32)
Calcium: 8.7 mg/dL — ABNORMAL LOW (ref 8.9–10.3)
Chloride: 107 mmol/L (ref 98–111)
Creatinine, Ser: 1.05 mg/dL (ref 0.61–1.24)
GFR, Estimated: >60 mL/min (ref >60)
Glucose, Bld: 97 mg/dL (ref 70–99)
Potassium: 4 mmol/L (ref 3.5–5.1)
Sodium: 137 mmol/L (ref 135–145)
Total Bilirubin: 2.9 mg/dL — ABNORMAL HIGH (ref 0.3–1.2)
Total Protein: 6.7 g/dL (ref 6.5–8.1)

## 2020-08-18 LAB — CBC WITH DIFFERENTIAL/PLATELET
Abs Immature Granulocytes: 0.03 10*3/uL (ref 0.00–0.07)
Basophils Absolute: 0.1 10*3/uL (ref 0.0–0.1)
Basophils Relative: 1 %
Eosinophils Absolute: 0.3 10*3/uL (ref 0.0–0.5)
Eosinophils Relative: 4 %
HCT: 30.6 % — ABNORMAL LOW (ref 39.0–52.0)
Hemoglobin: 10.7 g/dL — ABNORMAL LOW (ref 13.0–17.0)
Immature Granulocytes: 0 %
Lymphocytes Relative: 21 %
Lymphs Abs: 1.6 10*3/uL (ref 0.7–4.0)
MCH: 34.5 pg — ABNORMAL HIGH (ref 26.0–34.0)
MCHC: 35 g/dL (ref 30.0–36.0)
MCV: 98.7 fL (ref 80.0–100.0)
Monocytes Absolute: 0.7 10*3/uL (ref 0.1–1.0)
Monocytes Relative: 9 %
Neutro Abs: 4.9 10*3/uL (ref 1.7–7.7)
Neutrophils Relative %: 65 %
Platelets: 185 10*3/uL (ref 150–400)
RBC: 3.1 MIL/uL — ABNORMAL LOW (ref 4.22–5.81)
RDW: 16.4 % — ABNORMAL HIGH (ref 11.5–15.5)
WBC: 7.5 10*3/uL (ref 4.0–10.5)
nRBC: 0 % (ref 0.0–0.2)

## 2020-08-18 LAB — PROTIME-INR
INR: 1.4 — ABNORMAL HIGH (ref 0.8–1.2)
Prothrombin Time: 16.9 seconds — ABNORMAL HIGH (ref 11.4–15.2)

## 2020-08-18 MED ORDER — HEPARIN SOD (PORK) LOCK FLUSH 100 UNIT/ML IV SOLN
500.0000 [IU] | INTRAVENOUS | Status: AC | PRN
  Administered 2020-08-18: 500 [IU]

## 2020-08-18 MED ORDER — SODIUM CHLORIDE 0.9 % IV SOLN: INTRAVENOUS | Status: DC

## 2020-08-18 MED ORDER — HEPARIN SOD (PORK) LOCK FLUSH 100 UNIT/ML IV SOLN
INTRAVENOUS | Status: AC
  Filled 2020-08-18: qty 5

## 2020-08-18 MED ORDER — LIDOCAINE HCL 1 % IJ SOLN
INTRAMUSCULAR | Status: AC
  Filled 2020-08-18: qty 20

## 2020-08-18 NOTE — Discharge Instructions (Signed)
Please call Interventional Radiology clinic [phone] with any questions or concerns.  You may remove your dressing and shower tomorrow.   Hepatic Artery Radioembolization, Care After This sheet gives you information about how to care for yourself after your procedure. Your health care provider may also give you more specific instructions. If you have problems or questions, contact your health care provider. What can I expect after the procedure? After the procedure, it is common to have:  A slight fever for 1-2 weeks. If your fever gets worse, tell your health care provider.  Tiredness (fatigue).  Loss of appetite. This should gradually improve after about 1 week.  Abdominal pain on your right side.  Soreness and tenderness in your groin area where the needle and catheter were placed (puncture site). Follow these instructions at home: Puncture site care  Follow instructions from your health care provider about how to take care of the puncture site. Make sure you: ? Wash your hands with soap and water for at least 20 seconds before and after you change your bandage (dressing). If soap and water are not available, use hand sanitizer. ? Change your dressing as told by your health care provider. ? Leave stitches (sutures), skin glue, or adhesive strips in place. These skin closures may need to stay in place for 2 weeks or longer. If adhesive strip edges start to loosen and curl up, you may trim the loose edges. Do not remove adhesive strips completely unless your health care provider tells you to do that.  Check your puncture site every day for signs of infection. Check for: ? More redness, swelling, or pain. ? Fluid or blood. ? Warmth. ? Pus or a bad smell.   Activity  Rest as told by your health care provider.  Avoid sitting for a long time without moving. Get up to take short walks every 1-2 hours. This is important to improve blood flow and breathing. Ask for help if you feel  weak or unsteady.  Return to your normal activities as told by your health care provider. Ask your health care provider what activities are safe for you.  If you were given a sedative during the procedure, it can affect you for several hours. Do not drive or operate machinery until your health care provider says that it is safe.  Do not lift anything that is heavier than 10 lb (4.5 kg), or the limit that you are told, until your health care provider says that it is safe. Medicines  Take over-the-counter and prescription medicines only as told by your health care provider.  Ask your health care provider if the medicine prescribed to you: ? Requires you to avoid driving or using machinery. ? Can cause constipation. You may need to take these actions to prevent or treat constipation:  Drink enough fluid to keep your urine pale yellow.  Take over-the-counter or prescription medicines.  Eat foods that are high in fiber, such as beans, whole grains, and fresh fruits and vegetables.  Limit foods that are high in fat and processed sugars, such as fried or sweet foods. Radiation precautions For up to a week after your procedure, there will be a small amount of radioactivity near your liver. This is not especially dangerous to other people. However, as told by your health care provider, you should follow these precautions for 7 days:  Do not come in close contact with people.  Do not sleep in the same bed as someone else.  Do not hold  children or babies.  Do not have contact with pregnant women. General instructions  Eat frequent, small meals until your appetite returns. Follow instructions from your health care provider about eating or drinking restrictions.  Do not take baths, swim, or use a hot tub until your health care provider approves. You may take showers. Wash your puncture site with mild soap and water, and pat the area dry.  Wear compression stockings as told by your health  care provider. These stockings help to prevent blood clots and reduce swelling in your legs.  Keep all follow-up visits as told by your health care provider. This is important. You may need to have blood tests and imaging tests done.   Contact a health care provider if:  You have any of these signs of infection: ? More redness, swelling, or pain around your puncture site. ? Fluid or blood coming from your puncture site. ? Warmth coming from your puncture site. ? Pus or a bad smell coming from your puncture site.  You have pain that: ? Gets worse. ? Does not get better with medicine. ? Feels like very bad heartburn. ? Is in the middle of your abdomen, above your belly button.  You have any signs of infection or liver failure, such as: ? Your skin or the white parts of your eyes turn yellow (jaundice). ? The color of your urine changes to dark brown. ? The color of your stool (feces) changes to light yellow. ? Your abdominal measurement (girth) increases in a short period of time. ? You gain more than 5 lb (2.3 kg) in a short period of time. Get help right away if you:  Have a fever that lasts longer than 2 weeks or is higher than what your health care provider told you to expect.  Develop any of the following in your legs: ? Pain. ? Swelling. ? Skin that is cold or pale or turns blue.  Have chest pain.  Have blood in your vomit, saliva, or stool.  Have trouble breathing. These symptoms may represent a serious problem that is an emergency. Do not wait to see if the symptoms will go away. Get medical help right away. Call your local emergency services (911 in the [place] Do not drive yourself to the hospital. Summary  After the procedure, it is common to have a slight fever for 1-2 weeks, tiredness, loss of appetite, abdominal pain on the right side, and groin tenderness where the catheter was placed.  For up to a week after your procedure, as told by your health care provider,  do not come in close contact with people.  Follow instructions from your health care provider about how to take care of the puncture site.  Contact a health care provider if you have any signs of infection.  Get help right away if you develop pain or swelling in your legs or if your legs feel cool or look pale. This information is not intended to replace advice given to you by your health care provider. Make sure you discuss any questions you have with your health care provider. Document Revised: 04/30/2019 Document Reviewed: 04/30/2019 Elsevier Patient Education  2021 [employer].   Moderate Conscious Sedation, Adult, Care After This sheet gives you information about how to care for yourself after your procedure. Your health care provider may also give you more specific instructions. If you have problems or questions, contact your health care provider. What can I expect after the procedure? After the procedure,  it is common to have:  Sleepiness for several hours.  Impaired judgment for several hours.  Difficulty with balance.  Vomiting if you eat too soon. Follow these instructions at home: For the time period you were told by your health care provider:  Rest.  Do not participate in activities where you could fall or become injured.  Do not drive or use machinery.  Do not drink alcohol.  Do not take sleeping pills or medicines that cause drowsiness.  Do not make important decisions or sign legal documents.  Do not take care of children on your own.      Eating and drinking  Follow the diet recommended by your health care provider.  Drink enough fluid to keep your urine pale yellow.  If you vomit: ? Drink water, juice, or soup when you can drink without vomiting. ? Make sure you have little or no nausea before eating solid foods.   General instructions  Take over-the-counter and prescription medicines only as told by your health care provider.  Have a  responsible adult stay with you for the time you are told. It is important to have someone help care for you until you are awake and alert.  Do not smoke.  Keep all follow-up visits as told by your health care provider. This is important. Contact a health care provider if:  You are still sleepy or having trouble with balance after 24 hours.  You feel light-headed.  You keep feeling nauseous or you keep vomiting.  You develop a rash.  You have a fever.  You have redness or swelling around the IV site. Get help right away if:  You have trouble breathing.  You have new-onset confusion at home. Summary  After the procedure, it is common to feel sleepy, have impaired judgment, or feel nauseous if you eat too soon.  Rest after you get home. Know the things you should not do after the procedure.  Follow the diet recommended by your health care provider and drink enough fluid to keep your urine pale yellow.  Get help right away if you have trouble breathing or new-onset confusion at home. This information is not intended to replace advice given to you by your health care provider. Make sure you discuss any questions you have with your health care provider. Document Revised: 11/09/2019 Document Reviewed: 06/07/2019 Elsevier Patient Education  2021 [place] Y-90 Radioembolization Discharge Instructions  You have been given a radioactive material during your procedure.  While it is safe for you to be discharged home from the hospital, you need to proceed directly home.    Do not use public transportation, including air travel, lasting more than 2 hours for 1 week.  Avoid crowded public places for 1 week.  Adult visitors should try to avoid close contact with you for 1 week.    Children and pregnant females should not visit or have close contact with you for 1 week.  Items that you touch are not radioactive.  Do not sleep in the same bed as your partner for 1 week,  and a condom should be used for sexual activity during the first 24 hours.  Your blood may be radioactive and caution should be used if any bleeding occurs during the recovery period.  Body fluids may be radioactive for 24 hours.  Wash your hands after voiding.  Men should sit to urinate.  Dispose of any soiled materials (flush down toilet or place in trash at  home) during the first day.  Drink 6 to 8 glasses of fluids per day for 5 days to hydrate yourself.  If you need to see a doctor during the first week, you must let them know that you were treated with yttrium-90 microspheres, and will be slightly radioactive.  They can call Interventional Radiology [phone] with any questions.

## 2020-08-18 NOTE — H&P (Addendum)
Referring Physician(s): Finnegan,T  Supervising Physician: Jacqulynn Cadet  Patient Status:  [facility]  Chief Complaint:  Multifocal hepatocellular carcinoma  Subjective: Patient familiar to IR service from left liver mass biopsy on 04/15/2020, Port-A-Cath placement on 05/02/2020 and tele consultation with Dr. 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  He also has a history of hepatitis C, cirrhosis, hypertension, and prior stroke with residual right-sided weakness.  Following discussions with Dr. Laurence Ferrari he was deemed an appropriate candidate for Y-90 hepatic radioembolization and presents today for arterial roadmapping/test Y 90 dosing.  He currently denies fever, headache, chest pain, back pain, nausea, vomiting or bleeding.  He does have epigastric discomfort, dyspnea with exertion and occasional cough.  Additional history as below.  Past Medical History:  Diagnosis Date  . Cancer (HCC)    liver/LUNG  . Headache    almost completely unbearable.  only time improves this  . Hepatitis A 04/2020   C+  . Hypertension   . Stroke [facility]) 2010   right sided weakness remains   Past Surgical History:  Procedure Laterality Date  . ARTERY BIOPSY Right 06/13/2020   Procedure: BIOPSY TEMPORAL ARTERY;  Surgeon: Algernon Huxley, MD;  Location: [facility] ORS;  Service: Vascular;  Laterality: Right;  . IR IMAGING GUIDED PORT INSERTION  05/02/2020  . IR RADIOLOGIST EVAL & MGMT  07/03/2020      Allergies: Patient has no known allergies.  Medications: Prior to Admission medications   Medication Sig Start Date End Date Taking? Authorizing Provider  ALPRAZolam (XANAX) 0.25 MG tablet Take 1 tablet (0.25 mg total) by mouth at bedtime as needed (for sleep). 08/13/20  Yes Lloyd Huger, MD  buPROPion [facility]) 150 MG 12 hr tablet Take 1 tablet by mouth in the morning and at bedtime. 05/15/15  Yes [provider]  capsicum (ZOSTRIX)  0.075 % topical cream Apply 1 application topically every 8 (eight) hours.  08/03/11  Yes [provider]  lidocaine-prilocaine (EMLA) cream Apply to affected area once 04/23/20  Yes Finnegan, Kathlene November, MD  lisinopril-hydrochlorothiazide (ZESTORETIC) 20-12.5 MG tablet Take 1 tablet by mouth daily. 03/19/20 03/19/21 Yes [provider]  Oxycodone HCl 10 MG TABS Take 1 tablet (10 mg total) by mouth 4 (four) times daily as needed. 08/13/20  Yes Lloyd Huger, MD  prochlorperazine (COMPAZINE) 10 MG tablet Take 1 tablet (10 mg total) by mouth every 6 (six) hours as needed (Nausea or vomiting). 06/27/20  Yes Borders, Kirt Boys, NP     Vital Signs: BP 132/86   Pulse 86   Temp 98.1 F (36.7 C) (Oral)   Resp 20   SpO2 100%   Physical Exam:  patient slightly drowsy but arousable.  Answers questions okay.  Chest with distant breath sounds bilaterally.  Clean, intact right chest wall Port-A-Cath.  Heart with regular rate and rhythm.  Abdomen soft, positive bowel sounds, some mild epigastric tenderness to palpation.  Bilateral pretibial edema noted.  Imaging: No results found.  Labs:  CBC: Recent Labs    07/08/20 1722 07/23/20 0914 08/13/20 0946 08/18/20 0829  WBC 9.5 5.3 6.2 7.5  HGB 11.5* 11.5* 11.0* 10.7*  HCT 33.6* 32.6* 30.9* 30.6*  PLT 288 184 196 185    COAGS: Recent Labs    04/01/20 1324 04/15/20 1009  INR 1.4* 1.2    BMP: Recent Labs    04/01/20 1023 04/04/20 1007 05/07/20 0943 07/02/20 1056 07/08/20 1722 07/23/20 0914 08/13/20 6433  NA 133* 133*   < > 135 136 136 134*  K 4.3 4.6   < > 4.1 3.9 4.0 3.7  CL 101 98   < > 102 104 106 102  CO2 28 27   < > 25 24 25 25   GLUCOSE 90 121*   < > 99 130* 78 138*  BUN 11 11   < > 16 20 13 17   CALCIUM 8.2* 8.2*   < > 8.2* 8.3* 8.1* 7.8*  CREATININE 1.02 1.09   < > 1.11 1.40* 0.98 1.16  GFRNONAA >60 >60   < > >60 55* >60 >60  GFRAA >60 >60  --   --   --   --   --    < > = values in this interval not  displayed.    LIVER FUNCTION TESTS: Recent Labs    07/02/20 1056 07/08/20 1722 07/23/20 0914 08/13/20 0946  BILITOT 1.2 1.7* 1.4* 1.9*  AST 102* 117* 123* 96*  ALT 53* 58* 52* 39  ALKPHOS 211* 220* 197* 226*  PROT 6.5 6.8 6.8 6.6  ALBUMIN 2.0* 2.0* 2.0* 1.7*    Assessment and Plan: Patient familiar to IR service from left liver mass biopsy on 04/15/2020, Port-A-Cath placement on 05/02/2020 and tele consultation with Dr. 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  He also has a history of hepatitis C, cirrhosis, hypertension, and prior stroke with residual right-sided weakness.  Following discussions with Dr. Laurence Ferrari he was deemed an appropriate candidate for Y-90 hepatic radioembolization and presents today for arterial roadmapping/test Y 90 dosing. Risks and benefits of procedure were discussed with the patient including, but not limited to bleeding, infection, vascular injury or contrast induced renal failure.  This interventional procedure involves the use of X-rays and because of the nature of the planned procedure, it is possible that we will have prolonged use of X-ray fluoroscopy.  Potential radiation risks to you include (but are not limited to) the following: - A slightly elevated risk for cancer  several years later in life. This risk is typically less than 0.5% percent. This risk is low in comparison to the normal incidence of human cancer, which is 33% for women and 50% for men according to the [employer]. - Radiation induced injury can include skin redness, resembling a rash, tissue breakdown / ulcers and hair loss (which can be temporary or permanent).   The likelihood of either of these occurring depends on the difficulty of the procedure and whether you are sensitive to radiation due to previous procedures, disease, or genetic conditions.   IF your procedure requires a prolonged use of radiation, you will  be notified and given written instructions for further action.  It is your responsibility to monitor the irradiated area for the 2 weeks following the procedure and to notify your physician if you are concerned that you have suffered a radiation induced injury.    All of the patient's questions were answered, patient is agreeable to proceed.  Consent signed and in chart.  Pt's T bili today is 2.9; will address with Dr. Laurence Ferrari to see if procedure may be cancelled    Electronically Signed: D. Rowe Robert, PA-C 08/18/2020, 8:41 AM   I spent a total of 25 minutes at the the patient's bedside AND on the patient's hospital floor or unit, greater than 50% of which was counseling/coordinating care for visceral/hepatic arteriogram with embolization/test Y 90 dosing

## 2020-08-19 ENCOUNTER — Inpatient Hospital Stay (HOSPITAL_BASED_OUTPATIENT_CLINIC_OR_DEPARTMENT_OTHER): Payer: Medicare HMO | Admitting: Oncology

## 2020-08-19 ENCOUNTER — Other Ambulatory Visit: Payer: Self-pay | Admitting: *Deleted

## 2020-08-19 ENCOUNTER — Encounter: Payer: Self-pay | Admitting: Oncology

## 2020-08-19 ENCOUNTER — Inpatient Hospital Stay: Payer: Medicare HMO | Admitting: Hospice and Palliative Medicine

## 2020-08-19 VITALS — BP 84/70 | HR 91 | Temp 97.8°F | Resp 20

## 2020-08-19 DIAGNOSIS — C220 Liver cell carcinoma: Secondary | ICD-10-CM

## 2020-08-19 MED ORDER — MIRTAZAPINE 15 MG PO TABS: 15.0000 mg | ORAL_TABLET | Freq: Every day | ORAL | 1 refills | Status: DC

## 2020-08-19 MED ORDER — OXYCODONE HCL 10 MG PO TABS: 10.0000 mg | ORAL_TABLET | Freq: Four times a day (QID) | ORAL | 0 refills | Status: AC | PRN

## 2020-08-19 NOTE — Progress Notes (Signed)
Patient here today for follow up for hepatocellular carcinoma. Patient reports worsening fatigue and increased weakness over the last week.

## 2020-08-19 NOTE — Progress Notes (Signed)
[place]  [phone] [phone]  ID: Gaylyn Lambert OB: June 07, 1951  MR#: [id number]  [id number]  Patient Care Team: Romualdo Bolk, FNP as PCP - General (Nurse Practitioner) Clent Jacks, RN as Oncology Nurse Navigator  CHIEF COMPLAINT: Stage IIIa [facility]  INTERVAL HISTORY: Patient returns to clinic today as an add-on after being evaluated by interventional radiology yesterday and found to have an increasing total bilirubin and decreasing performance status. His weakness and fatigue has become worse over the past week and he now has a new onset cough. He continues to have difficulty sleeping at night. He denies any fevers. His appetite has improved and his weight is stable. He continues to have right leg weakness.  He has no other neurologic complaints. He has no chest pain, shortness of breath, or hemoptysis. His abdominal pain is well controlled with his current narcotic regimen. He denies any nausea, vomiting, constipation, or diarrhea.  He has no urinary complaints. Patient offers no further specific complaints today.  REVIEW OF SYSTEMS:   Review of Systems  Constitutional: Positive for malaise/fatigue. Negative for fever and weight loss.  Respiratory: Positive for cough. Negative for shortness of breath.   Cardiovascular: Negative.  Negative for chest pain and leg swelling.  Gastrointestinal: Positive for abdominal pain. Negative for blood in stool, constipation, nausea and vomiting.  Genitourinary: Negative.  Negative for flank pain.  Musculoskeletal: Negative.  Negative for back pain.  Skin: Negative.  Negative for rash.  Neurological: Positive for focal weakness and weakness. Negative for dizziness and headaches.  Psychiatric/Behavioral: The patient has insomnia. The patient is not nervous/anxious.     As per HPI. Otherwise, a complete review of systems is negative.  PAST MEDICAL HISTORY: Past Medical History:  Diagnosis  Date  . Cancer (HCC)    liver/LUNG  . Headache    almost completely unbearable.  only time improves this  . Hepatitis A 04/2020   C+  . Hypertension   . Stroke [facility]) 2010   right sided weakness remains    PAST SURGICAL HISTORY: Past Surgical History:  Procedure Laterality Date  . ARTERY BIOPSY Right 06/13/2020   Procedure: BIOPSY TEMPORAL ARTERY;  Surgeon: Algernon Huxley, MD;  Location: [facility] ORS;  Service: Vascular;  Laterality: Right;  . IR IMAGING GUIDED PORT INSERTION  05/02/2020  . IR RADIOLOGIST EVAL & MGMT  07/03/2020    FAMILY HISTORY: Family History  Problem Relation Age of Onset  . Aneurysm Mother   . Diabetes Father     ADVANCED DIRECTIVES (Y/N):  N  HEALTH MAINTENANCE: Social History   Tobacco Use  . Smoking status: Former Smoker    Packs/day: 0.50    Years: 40.00    Pack years: 20.00    Types: Cigarettes  . Smokeless tobacco: Never Used  . Tobacco comment: has not smoked in one month  Vaping Use  . Vaping Use: Never used  Substance Use Topics  . Alcohol use: Not Currently  . Drug use: Not Currently    Comment: PTS SISTER [place] HE HAS USED DRUGS IN THE PAST-UNSURE WHICH DRUGS WERE USED AND HOW LONG AGO     Colonoscopy:  PAP:  Bone density:  Lipid panel:  No Known Allergies  Current Outpatient Medications  Medication Sig Dispense Refill  . buPROPion (WELLBUTRIN SR) 150 MG 12 hr tablet Take 1 tablet by mouth in the morning and at bedtime.    . capsicum (ZOSTRIX) 0.075 % topical cream Apply 1 application  topically every 8 (eight) hours.     . lidocaine-prilocaine (EMLA) cream Apply to affected area once 30 g 3  . lisinopril-hydrochlorothiazide (ZESTORETIC) 20-12.5 MG tablet Take 1 tablet by mouth daily.    . mirtazapine (REMERON) 15 MG tablet Take 1 tablet (15 mg total) by mouth at bedtime. 30 tablet 1  . prochlorperazine (COMPAZINE) 10 MG tablet Take 1 tablet (10 mg total) by mouth every 6 (six) hours as needed (Nausea or vomiting). 60  tablet 2  . Oxycodone HCl 10 MG TABS Take 1 tablet (10 mg total) by mouth 4 (four) times daily as needed. 90 tablet 0   No current facility-administered medications for this visit.    OBJECTIVE: Vitals:   08/19/20 1008  BP: (!) 84/70  Pulse: 91  Resp: 20  Temp: 97.8 F (36.6 C)  SpO2: 99%     There is no height or weight on file to calculate BMI.    ECOG FS:2 - Symptomatic, <50% confined to bed  General: Thin, no acute distress. Eyes: Pink conjunctiva, anicteric sclera. HEENT: Normocephalic, moist mucous membranes. Lungs: No audible wheezing or coughing. Heart: Regular rate and rhythm. Abdomen: Soft, nontender, no obvious distention. Musculoskeletal: No edema, cyanosis, or clubbing. Neuro: Alert, answering all questions appropriately. Cranial nerves grossly intact. Skin: No rashes or petechiae noted. Psych: Normal affect.  LAB RESULTS:  Lab Results  Component Value Date   NA 137 08/18/2020   K 4.0 08/18/2020   CL 107 08/18/2020   CO2 23 08/18/2020   GLUCOSE 97 08/18/2020   BUN 22 08/18/2020   CREATININE 1.05 08/18/2020   CALCIUM 8.7 (L) 08/18/2020   PROT 6.7 08/18/2020   ALBUMIN 1.8 (L) 08/18/2020   AST 115 (H) 08/18/2020   ALT 42 08/18/2020   ALKPHOS 187 (H) 08/18/2020   BILITOT 2.9 (H) 08/18/2020   GFRNONAA >60 08/18/2020   GFRAA >60 04/04/2020    Lab Results  Component Value Date   WBC 7.5 08/18/2020   NEUTROABS 4.9 08/18/2020   HGB 10.7 (L) 08/18/2020   HCT 30.6 (L) 08/18/2020   MCV 98.7 08/18/2020   PLT 185 08/18/2020     STUDIES: No results found.  ASSESSMENT: Stage IIIa HCC  PLAN:    1. Stage IIIa HCC: Although patient's AFP is only minimally elevated at 12.1, biopsy confirmed hepatocellular carcinoma.  PET scan results reviewed independently confirming stage of disease.  His most recent MRI on July 12, 2020 essentially revealed stable disease. Given his bilirubin greater than 2.0, ablation with Y 90 is no longer an option. There is  concern of progression of disease given his declining performance status. Will get an MRI in the next week presuming a Covid test is negative. Return to clinic 1 to 2 days after his MRI for discussion of the results. We also briefly discussed comfort care and hospice today which patient will further discuss with his brother and sister who were present at the appointment. 2. Renal insufficiency: Resolved. 3. Elevated liver enzymes: AST is trended up while the ALT has normalized. Repeat laboratory work with MRI as above. 4.  Hyperbilirubinemia: Bilirubin continues to trend up and is now 2.9. Patient cannot undergo Y 90 ablation if bilirubin is greater than 2.0. 5.  Pain: Continue oxycodone as needed.  Patient was given a refill today. 6.  Insomnia: Patient states Xanax does not help and was given a prescription for Remeron today. 7.  Weight loss: Improving. 8.  Y 90 ablation: Appreciate interventional radiology input. No longer  an option with elevated bilirubin. 9. Cough: Patient has been instructed to get Covid tested and call clinic with results.   Patient expressed understanding and was in agreement with this plan. He also understands that He can call clinic at any time with any questions, concerns, or complaints.   Cancer Staging Hepatocellular carcinoma [facility]) Staging form: Liver, AJCC 8th Edition - Clinical stage from 05/01/2020: Stage IIIA (cT3, cN0, cM0) - Signed by Lloyd Huger, MD on 05/01/2020   Lloyd Huger, MD   08/19/2020 2:48 PM

## 2020-08-21 ENCOUNTER — Other Ambulatory Visit: Payer: Self-pay

## 2020-08-21 ENCOUNTER — Ambulatory Visit
Admission: RE | Admit: 2020-08-21 | Discharge: 2020-08-21 | Disposition: A | Discharge status: Home / Self Care | Payer: Medicare HMO | Source: Ambulatory Visit | Attending: Oncology | Admitting: Oncology

## 2020-08-21 DIAGNOSIS — C220 Liver cell carcinoma: Secondary | ICD-10-CM | POA: Diagnosis not present

## 2020-08-21 IMAGING — MR MR ABDOMEN WO/W CM
18 series · 48 of 48 positions shown · IV contrast (gadavist)
Comparison: Abdominal MRI [DATE] and abdominopelvic CT
[DATE].

CLINICAL DATA: Elevated serum bilirubin and progressive weakness.
Follow-up hepatocellular carcinoma.

EXAM:
MRI ABDOMEN WITHOUT AND WITH CONTRAST
TECHNIQUE: Multiplanar multisequence MR imaging of the abdomen was performed
both before and after the administration of intravenous contrast.
CONTRAST:  7mL GADAVIST GADOBUTROL 1 MMOL/ML IV SOLN

[Series 2: T2 · coronal · 6.0mm · 1.19mm/px · 1 of 30 slices shown (1 of 2)]
[im 1/30]
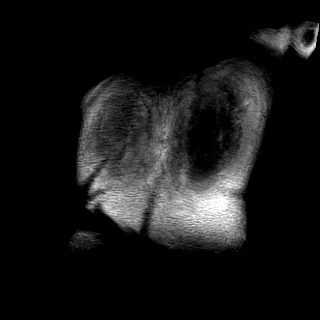

[Series 3: T2 · axial · 6.0mm · 1.19mm/px · 1 of 36 slices shown (2 of 2)]
[im 1/36]
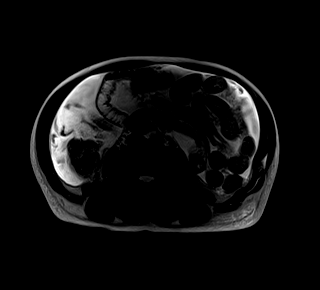

[Series 5: T2 fat-sat · axial · 6.0mm · 1.19mm/px · z∈[-30,+222]mm · 2 of 36 slices shown]
[im 1/36]
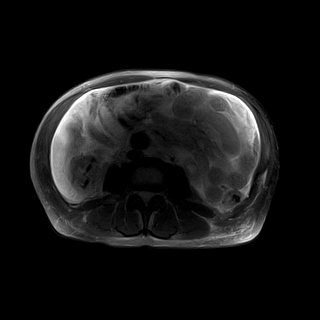
[im 36/36]
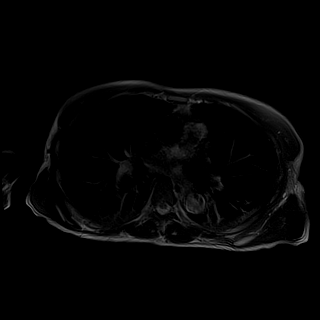

[Series 6: ax dwi_tracew · axial · 6.0mm · 1.42mm/px · z∈[-30,+222]mm · 4 of 106 slices shown]
[im 1/106]
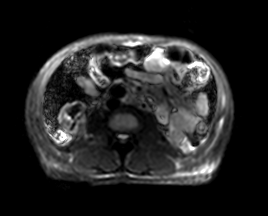
[im 36/106]
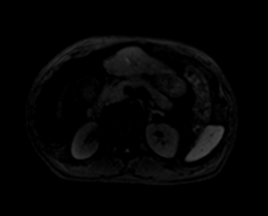
[im 71/106]
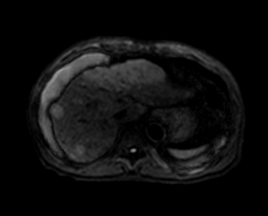
[im 106/106]
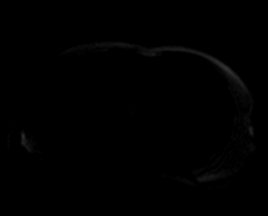

[Series 7: ax dwi_adc · axial · 6.0mm · 1.42mm/px · z∈[-30,+222]mm · 2 of 36 slices shown]
[im 1/36]
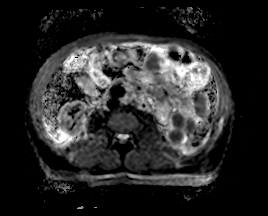
[im 36/36]
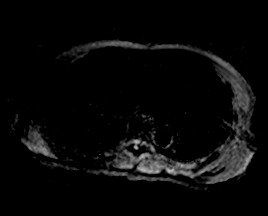

[Series 8: bSSFP · axial · 6.0mm · 0.74mm/px · z∈[-30,+222]mm · 2 of 36 slices shown]
[im 1/36]
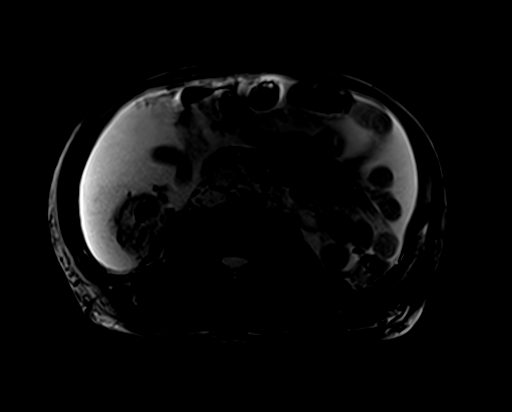
[im 36/36]
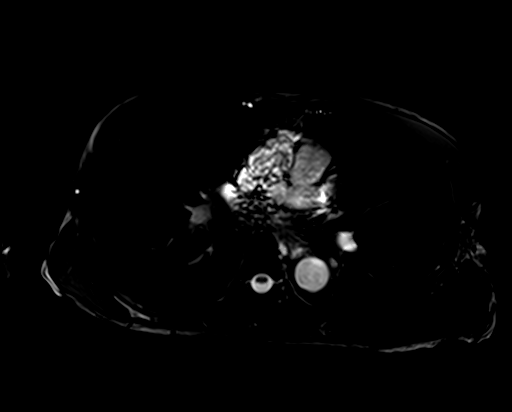

[Series 9: ax in & · axial · 3.5mm · 1.19mm/px · z∈[-28,+220]mm · 3 of 72 slices shown (1 of 2)]
[im 1/72]
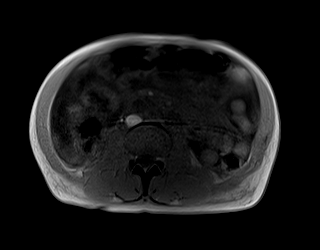
[im 36/72]
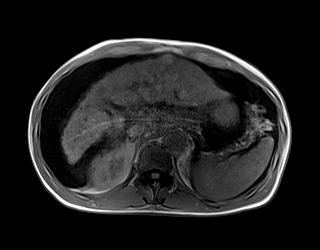
[im 72/72]
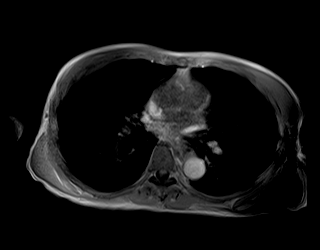

[Series 9: ax in & · axial · 3.5mm · 1.19mm/px · z∈[-28,+220]mm · 3 of 72 slices shown (2 of 2)]
[im 1/72]
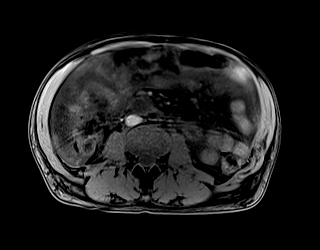
[im 36/72]
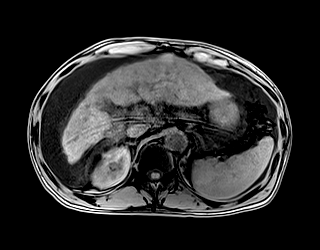
[im 72/72]
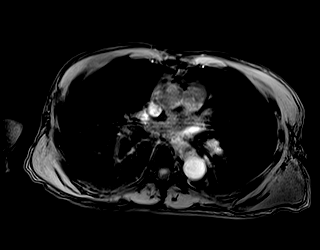

[Series 10: T1 dynamic fat-sat · axial · non-contrast · 3.5mm · 1.19mm/px · z∈[-28,+220]mm · 3 of 72 slices shown (1 of 5)]
[im 1/72]
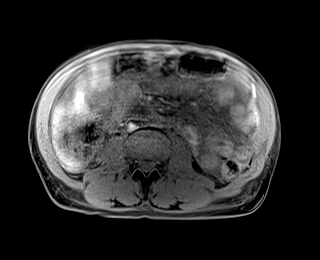
[im 36/72]
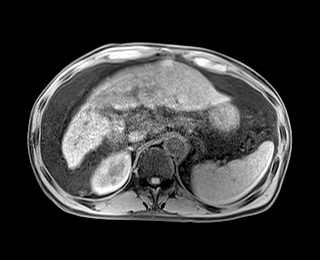
[im 72/72]
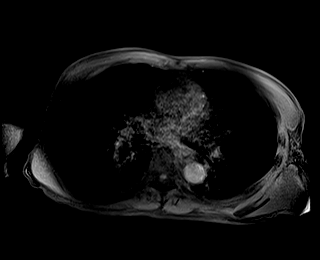

[Series 11: T1 dynamic fat-sat post-contrast · axial · 3.5mm · 1.19mm/px · z∈[-28,+220]mm · 3 of 72 slices shown (1 of 4)]
[im 1/72]
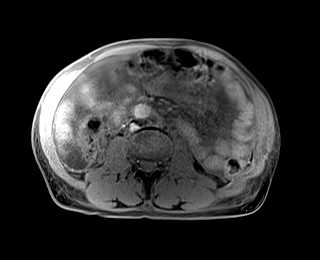
[im 36/72]
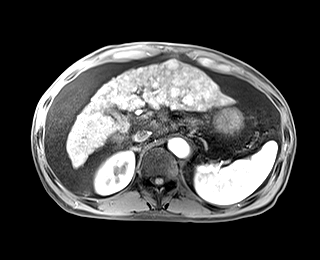
[im 72/72]
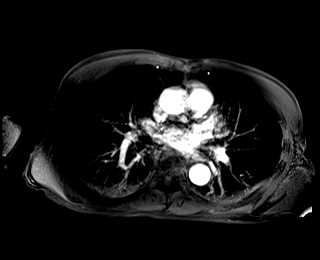

[Series 12: T1 dynamic fat-sat · axial · 3.5mm · 1.19mm/px · z∈[-28,+220]mm · 3 of 72 slices shown (2 of 5)]
[im 1/72]
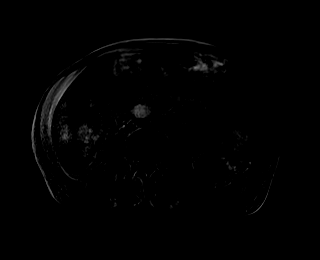
[im 36/72]
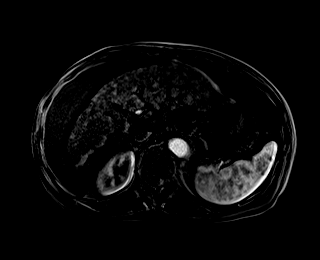
[im 72/72]
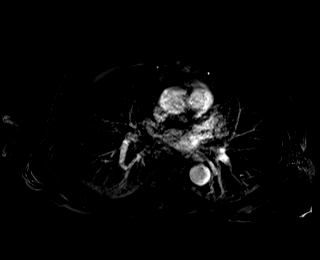

[Series 13: T1 dynamic fat-sat post-contrast · axial · 3.5mm · 1.19mm/px · z∈[-28,+220]mm · 3 of 72 slices shown (2 of 4)]
[im 1/72]
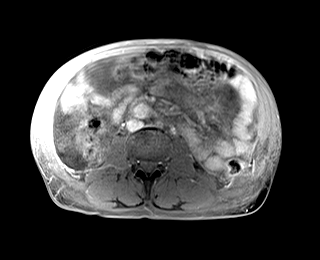
[im 36/72]
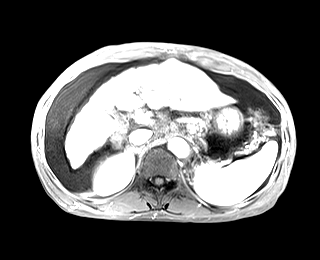
[im 72/72]
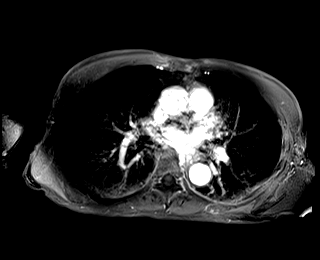

[Series 14: T1 dynamic fat-sat · axial · 3.5mm · 1.19mm/px · z∈[-28,+220]mm · 3 of 72 slices shown (3 of 5)]
[im 1/72]
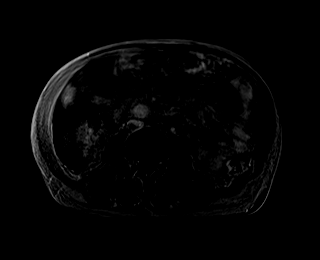
[im 36/72]
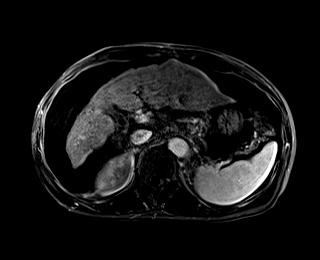
[im 72/72]
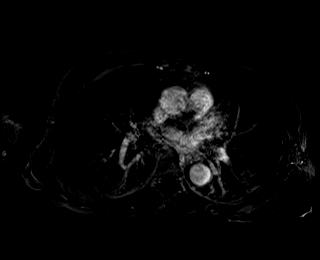

[Series 15: T1 dynamic fat-sat post-contrast · axial · 3.5mm · 1.19mm/px · z∈[-28,+220]mm · 3 of 72 slices shown (3 of 4)]
[im 1/72]
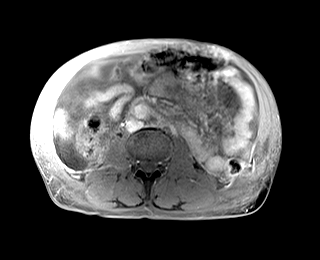
[im 36/72]
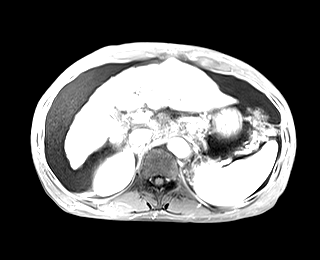
[im 72/72]
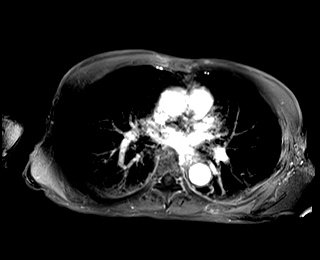

[Series 16: T1 dynamic fat-sat · axial · 3.5mm · 1.19mm/px · z∈[-28,+220]mm · 3 of 72 slices shown (4 of 5)]
[im 1/72]
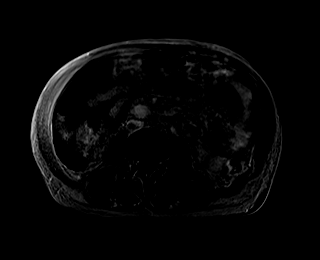
[im 36/72]
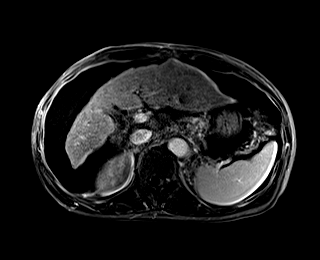
[im 72/72]
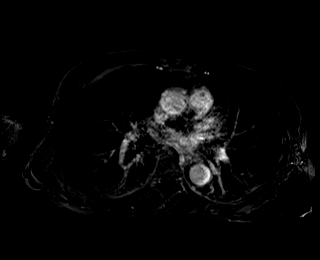

[Series 17: T1 dynamic post-contrast · coronal · 3.0mm · 1.31mm/px · 3 of 72 slices shown]
[im 1/72]
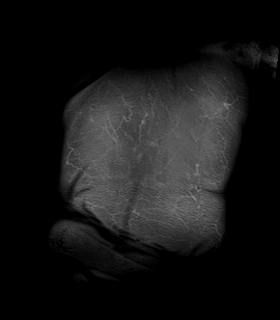
[im 36/72]
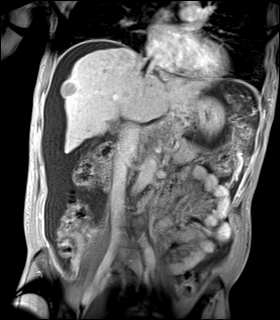
[im 72/72]
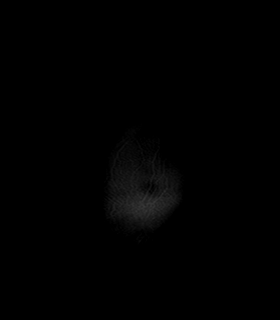

[Series 18: T1 dynamic fat-sat post-contrast · axial · 3.5mm · 1.19mm/px · z∈[-28,+220]mm · 3 of 72 slices shown (4 of 4)]
[im 1/72]
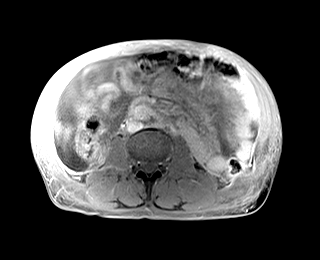
[im 36/72]
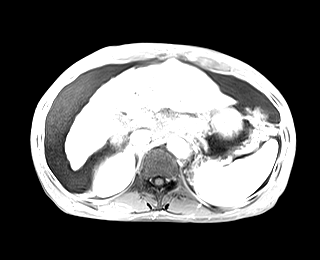
[im 72/72]
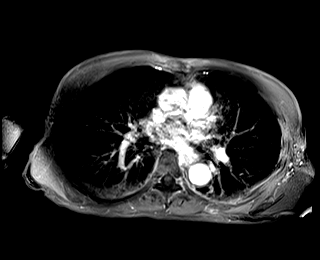

[Series 19: T1 dynamic fat-sat · axial · 3.5mm · 1.19mm/px · z∈[-28,+220]mm · 3 of 72 slices shown (5 of 5)]
[im 1/72]
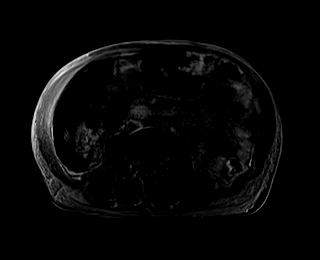
[im 36/72]
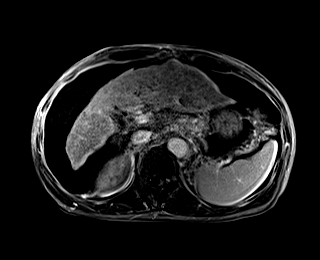
[im 72/72]
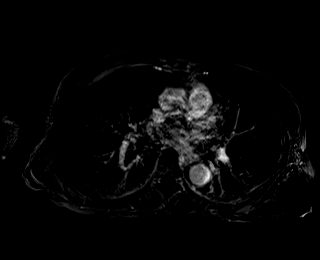

[48 of 48 positions shown; findings below may reference images not displayed]

FINDINGS: Lower chest: No significant findings are seen within the visualized
lower chest aside from probable small distal esophageal varices. No
significant pleural effusion.

Hepatobiliary: Again demonstrated are morphologic changes of
cirrhosis with widespread hypervascular lesions consistent with
multifocal hepatocellular carcinoma/metastatic disease. Interval
subjective mild worsening compared with the prior study of 6 weeks
ago. For example, a lesion in the dome of segment 4 measures 2.2 cm
on image [DATE] (previously 2.1 cm). Large exophytic lesion
projecting inferiorly from the left hepatic lobe measures 5.7 x
cm on image [DATE] (previously 5.3 x 4.7 cm). No evidence of portal or
hepatic vein tumor thrombus. There is mildly progressive diffuse
gallbladder wall thickening. No significant intra or extrahepatic
biliary dilatation.

Pancreas: Unremarkable. No pancreatic ductal dilatation or
surrounding inflammatory changes.

Spleen: Normal in size without focal abnormality.

Adrenals/Urinary Tract: Both adrenal glands appear normal. The
kidneys appear stable without hydronephrosis or focal mass lesion.
There is a small right renal cyst.

Stomach/Bowel: The stomach appears unremarkable for its degree of
distension. No evidence of bowel wall thickening, distention or
surrounding inflammatory change.

Vascular/Lymphatic: Mildly prominent lymph nodes in the
gastrohepatic ligament are unchanged. No acute vascular findings are
seen. As above, no evidence tumor thrombus in the portal or hepatic
veins.

Other: There is a large amount of ascites which has increased in
volume compared with the prior study. No definite peritoneal
nodularity to suggest metastatic disease. Fluid extends into a small
umbilical hernia.

Musculoskeletal: No acute or significant osseous findings. Lumbar
spondylosis noted.
IMPRESSION: 1. Interval subjective mild worsening in the multifocal
hepatocellular carcinoma compared with the prior study of 6 weeks
ago.
2. Increased volume of ascites, without peritoneal nodularity.
3. Mildly progressive diffuse gallbladder wall thickening, likely
related to liver disease and/or hypoalbuminemia. No evidence of
biliary dilatation.

## 2020-08-21 MED ORDER — GADOBUTROL 1 MMOL/ML IV SOLN
7.0000 mL | Freq: Once | INTRAVENOUS | Status: AC | PRN
  Administered 2020-08-21: 7 mL via INTRAVENOUS

## 2020-08-22 NOTE — Progress Notes (Signed)
[place]  Telephone:([phone]) [phone] [phone]  ID: Joshua Ramos OB: 04-18-1951  MR#: [id number]  VOH#:[id number]  Patient Care Team: Romualdo Bolk, FNP as PCP - General (Nurse Practitioner) Clent Jacks, RN as Oncology Nurse Navigator  CHIEF COMPLAINT: Progressive stage IIIa [facility]  INTERVAL HISTORY: Patient returns to clinic today for further evaluation and discussion of his laboratory work and imaging results.  He continues to have significant weakness and fatigue.  He admits his appetite has improved since initiating Remeron last week.  His pain is well controlled.  His cough has resolved.  He denies any fevers. He continues to have right leg weakness.  He has no other neurologic complaints. He has no chest pain, shortness of breath, or hemoptysis. He denies any nausea, vomiting, constipation, or diarrhea.  He has no urinary complaints.  Patient offers no further specific complaints today.  REVIEW OF SYSTEMS:   Review of Systems  Constitutional: Positive for malaise/fatigue. Negative for fever and weight loss.  Respiratory: Negative.  Negative for cough and shortness of breath.   Cardiovascular: Negative.  Negative for chest pain and leg swelling.  Gastrointestinal: Positive for abdominal pain. Negative for blood in stool, constipation, nausea and vomiting.  Genitourinary: Negative.  Negative for flank pain.  Musculoskeletal: Negative.  Negative for back pain.  Skin: Negative.  Negative for rash.  Neurological: Positive for focal weakness and weakness. Negative for dizziness and headaches.  Psychiatric/Behavioral: Negative.  The patient is not nervous/anxious and does not have insomnia.     As per HPI. Otherwise, a complete review of systems is negative.  PAST MEDICAL HISTORY: Past Medical History:  Diagnosis Date  . Cancer (HCC)    liver/LUNG  . Headache    almost completely unbearable.  only time improves this  . Hepatitis A 04/2020    C+  . Hypertension   . Stroke [facility]) 2010   right sided weakness remains    PAST SURGICAL HISTORY: Past Surgical History:  Procedure Laterality Date  . ARTERY BIOPSY Right 06/13/2020   Procedure: BIOPSY TEMPORAL ARTERY;  Surgeon: Algernon Huxley, MD;  Location: [facility] ORS;  Service: Vascular;  Laterality: Right;  . IR IMAGING GUIDED PORT INSERTION  05/02/2020  . IR RADIOLOGIST EVAL & MGMT  07/03/2020    FAMILY HISTORY: Family History  Problem Relation Age of Onset  . Aneurysm Mother   . Diabetes Father     ADVANCED DIRECTIVES (Y/N):  N  HEALTH MAINTENANCE: Social History   Tobacco Use  . Smoking status: Former Smoker    Packs/day: 0.50    Years: 40.00    Pack years: 20.00    Types: Cigarettes  . Smokeless tobacco: Never Used  . Tobacco comment: has not smoked in one month  Vaping Use  . Vaping Use: Never used  Substance Use Topics  . Alcohol use: Not Currently  . Drug use: Not Currently    Comment: PTS SISTER [place] HE HAS USED DRUGS IN THE PAST-UNSURE WHICH DRUGS WERE USED AND HOW LONG AGO     Colonoscopy:  PAP:  Bone density:  Lipid panel:  No Known Allergies  Current Outpatient Medications  Medication Sig Dispense Refill  . buPROPion (WELLBUTRIN SR) 150 MG 12 hr tablet Take 1 tablet by mouth in the morning and at bedtime.    . capsicum (ZOSTRIX) 0.075 % topical cream Apply 1 application topically every 8 (eight) hours.     . lidocaine-prilocaine (EMLA) cream Apply to affected area once  30 g 3  . lisinopril-hydrochlorothiazide (ZESTORETIC) 20-12.5 MG tablet Take 1 tablet by mouth daily.    . mirtazapine (REMERON) 30 MG tablet Take 1 tablet (30 mg total) by mouth at bedtime. 30 tablet 2  . Oxycodone HCl 10 MG TABS Take 1 tablet (10 mg total) by mouth 4 (four) times daily as needed. 90 tablet 0  . prochlorperazine (COMPAZINE) 10 MG tablet Take 1 tablet (10 mg total) by mouth every 6 (six) hours as needed (Nausea or vomiting). 60 tablet 2   No current  facility-administered medications for this visit.    OBJECTIVE: Vitals:   08/26/20 1053  BP: 114/79  Pulse: 85  Resp: 20  Temp: 97.7 F (36.5 C)  SpO2: 100%     There is no height or weight on file to calculate BMI.    ECOG FS:2 - Symptomatic, <50% confined to bed  General: Thin, no acute distress. Eyes: Pink conjunctiva, anicteric sclera. HEENT: Normocephalic, moist mucous membranes. Lungs: No audible wheezing or coughing. Heart: Regular rate and rhythm. Abdomen: Soft, nontender, no obvious distention. Musculoskeletal: No edema, cyanosis, or clubbing. Neuro: Alert, answering all questions appropriately. Cranial nerves grossly intact. Skin: No rashes or petechiae noted. Psych: Normal affect.  LAB RESULTS:  Lab Results  Component Value Date   NA 138 08/26/2020   K 4.2 08/26/2020   CL 108 08/26/2020   CO2 24 08/26/2020   GLUCOSE 81 08/26/2020   BUN 20 08/26/2020   CREATININE 1.12 08/26/2020   CALCIUM 8.0 (L) 08/26/2020   PROT 6.7 08/26/2020   ALBUMIN 1.9 (L) 08/26/2020   AST 93 (H) 08/26/2020   ALT 39 08/26/2020   ALKPHOS 190 (H) 08/26/2020   BILITOT 2.1 (H) 08/26/2020   GFRNONAA >60 08/26/2020   GFRAA >60 04/04/2020    Lab Results  Component Value Date   WBC 6.5 08/26/2020   NEUTROABS 2.8 08/26/2020   HGB 11.2 (L) 08/26/2020   HCT 32.3 (L) 08/26/2020   MCV 98.8 08/26/2020   PLT 244 08/26/2020     STUDIES: MR Abdomen W Wo Contrast  Result Date: 08/22/2020 CLINICAL DATA:  Elevated serum bilirubin and progressive weakness. Follow-up hepatocellular carcinoma. EXAM: MRI ABDOMEN WITHOUT AND WITH CONTRAST TECHNIQUE: Multiplanar multisequence MR imaging of the abdomen was performed both before and after the administration of intravenous contrast. CONTRAST:  38mL GADAVIST GADOBUTROL 1 MMOL/ML IV SOLN COMPARISON:  Abdominal MRI 07/12/2020 and abdominopelvic CT 04/01/2020. FINDINGS: Lower chest: No significant findings are seen within the visualized lower chest aside  from probable small distal esophageal varices. No significant pleural effusion. Hepatobiliary: Again demonstrated are morphologic changes of cirrhosis with widespread hypervascular lesions consistent with multifocal hepatocellular carcinoma/metastatic disease. Interval subjective mild worsening compared with the prior study of 6 weeks ago. For example, a lesion in the dome of segment 4 measures 2.2 cm on image 16/11 (previously 2.1 cm). Large exophytic lesion projecting inferiorly from the left hepatic lobe measures 5.7 x 5.0 cm on image 26/5 (previously 5.3 x 4.7 cm). No evidence of portal or hepatic vein tumor thrombus. There is mildly progressive diffuse gallbladder wall thickening. No significant intra or extrahepatic biliary dilatation. Pancreas: Unremarkable. No pancreatic ductal dilatation or surrounding inflammatory changes. Spleen: Normal in size without focal abnormality. Adrenals/Urinary Tract: Both adrenal glands appear normal. The kidneys appear stable without hydronephrosis or focal mass lesion. There is a small right renal cyst. Stomach/Bowel: The stomach appears unremarkable for its degree of distension. No evidence of bowel wall thickening, distention or surrounding inflammatory change.  Vascular/Lymphatic: Mildly prominent lymph nodes in the gastrohepatic ligament are unchanged. No acute vascular findings are seen. As above, no evidence tumor thrombus in the portal or hepatic veins. Other: There is a large amount of ascites which has increased in volume compared with the prior study. No definite peritoneal nodularity to suggest metastatic disease. Fluid extends into a small umbilical hernia. Musculoskeletal: No acute or significant osseous findings. Lumbar spondylosis noted. IMPRESSION: 1. Interval subjective mild worsening in the multifocal hepatocellular carcinoma compared with the prior study of 6 weeks ago. 2. Increased volume of ascites, without peritoneal nodularity. 3. Mildly progressive  diffuse gallbladder wall thickening, likely related to liver disease and/or hypoalbuminemia. No evidence of biliary dilatation. Electronically Signed   By: Richardean Sale M.D.   On: 08/22/2020 07:44    ASSESSMENT: Stage IIIa HCC  PLAN:    1. Stage IIIa HCC: Although patient's AFP is only minimally elevated at 12.1, biopsy confirmed hepatocellular carcinoma.  PET scan results reviewed independently confirming stage of disease.  MRI results from August 14, 2020 reviewed independently and reported above with progressive disease despite treatment.  Given his bilirubin greater than 2.0, ablation with Y 90 is no longer an option.  After lengthy discussion with the patient, his brother and sister, is agreed upon to discontinue treatment and enroll in hospice.  No further follow-up has been scheduled.  Appreciate palliative care input. 2. Renal insufficiency: Resolved. 3. Elevated liver enzymes: Chronic and unchanged. 4.  Hyperbilirubinemia: Chronic and unchanged.  Patient's bilirubin remains greater than 2.0.  Patient cannot undergo Y 90 ablation if bilirubin is greater than 2.0.  Discontinue treatment as above. 5.  Pain: Continue oxycodone as needed.  6.  Insomnia: Patient states Xanax does not help.  Increase Remeron from 15 to 30 mg daily.   7.  Weight loss: Improving. 8.  Y 90 ablation: Appreciate interventional radiology input. No longer an option with elevated bilirubin. 9. Cough: Resolved.   Patient expressed understanding and was in agreement with this plan. He also understands that He can call clinic at any time with any questions, concerns, or complaints.   Cancer Staging Hepatocellular carcinoma [facility]) Staging form: Liver, AJCC 8th Edition - Clinical stage from 05/01/2020: Stage IIIA (cT3, cN0, cM0) - Signed by Lloyd Huger, MD on 05/01/2020   Lloyd Huger, MD   08/27/2020 6:21 AM

## 2020-08-26 ENCOUNTER — Inpatient Hospital Stay (HOSPITAL_BASED_OUTPATIENT_CLINIC_OR_DEPARTMENT_OTHER): Payer: Medicare HMO | Admitting: Hospice and Palliative Medicine

## 2020-08-26 ENCOUNTER — Other Ambulatory Visit: Payer: Self-pay | Admitting: *Deleted

## 2020-08-26 ENCOUNTER — Telehealth: Payer: Self-pay | Admitting: *Deleted

## 2020-08-26 ENCOUNTER — Inpatient Hospital Stay: Payer: Medicare HMO | Attending: Oncology | Admitting: Oncology

## 2020-08-26 ENCOUNTER — Inpatient Hospital Stay: Payer: Medicare HMO

## 2020-08-26 VITALS — BP 114/79 | HR 85 | Temp 97.7°F | Resp 20

## 2020-08-26 DIAGNOSIS — C220 Liver cell carcinoma: Secondary | ICD-10-CM | POA: Diagnosis present

## 2020-08-26 DIAGNOSIS — G4700 Insomnia, unspecified: Secondary | ICD-10-CM | POA: Diagnosis not present

## 2020-08-26 DIAGNOSIS — E860 Dehydration: Secondary | ICD-10-CM

## 2020-08-26 DIAGNOSIS — Z515 Encounter for palliative care: Secondary | ICD-10-CM

## 2020-08-26 DIAGNOSIS — Z79899 Other long term (current) drug therapy: Secondary | ICD-10-CM | POA: Diagnosis not present

## 2020-08-26 DIAGNOSIS — I10 Essential (primary) hypertension: Secondary | ICD-10-CM | POA: Diagnosis not present

## 2020-08-26 DIAGNOSIS — R748 Abnormal levels of other serum enzymes: Secondary | ICD-10-CM | POA: Insufficient documentation

## 2020-08-26 DIAGNOSIS — Z87891 Personal history of nicotine dependence: Secondary | ICD-10-CM | POA: Insufficient documentation

## 2020-08-26 DIAGNOSIS — Z8673 Personal history of transient ischemic attack (TIA), and cerebral infarction without residual deficits: Secondary | ICD-10-CM | POA: Insufficient documentation

## 2020-08-26 LAB — COMPREHENSIVE METABOLIC PANEL
ALT: 39 U/L (ref 0–44)
AST: 93 U/L — ABNORMAL HIGH (ref 15–41)
Albumin: 1.9 g/dL — ABNORMAL LOW (ref 3.5–5.0)
Alkaline Phosphatase: 190 U/L — ABNORMAL HIGH (ref 38–126)
Anion gap: 6 (ref 5–15)
BUN: 20 mg/dL (ref 8–23)
CO2: 24 mmol/L (ref 22–32)
Calcium: 8 mg/dL — ABNORMAL LOW (ref 8.9–10.3)
Chloride: 108 mmol/L (ref 98–111)
Creatinine, Ser: 1.12 mg/dL (ref 0.61–1.24)
GFR, Estimated: >60 mL/min (ref >60)
Glucose, Bld: 81 mg/dL (ref 70–99)
Potassium: 4.2 mmol/L (ref 3.5–5.1)
Sodium: 138 mmol/L (ref 135–145)
Total Bilirubin: 2.1 mg/dL — ABNORMAL HIGH (ref 0.3–1.2)
Total Protein: 6.7 g/dL (ref 6.5–8.1)

## 2020-08-26 LAB — CBC WITH DIFFERENTIAL/PLATELET
Abs Immature Granulocytes: 0.04 10*3/uL (ref 0.00–0.07)
Basophils Absolute: 0.1 10*3/uL (ref 0.0–0.1)
Basophils Relative: 1 %
Eosinophils Absolute: 0.5 10*3/uL (ref 0.0–0.5)
Eosinophils Relative: 7 %
HCT: 32.3 % — ABNORMAL LOW (ref 39.0–52.0)
Hemoglobin: 11.2 g/dL — ABNORMAL LOW (ref 13.0–17.0)
Immature Granulocytes: 1 %
Lymphocytes Relative: 38 %
Lymphs Abs: 2.5 10*3/uL (ref 0.7–4.0)
MCH: 34.3 pg — ABNORMAL HIGH (ref 26.0–34.0)
MCHC: 34.7 g/dL (ref 30.0–36.0)
MCV: 98.8 fL (ref 80.0–100.0)
Monocytes Absolute: 0.7 10*3/uL (ref 0.1–1.0)
Monocytes Relative: 11 %
Neutro Abs: 2.8 10*3/uL (ref 1.7–7.7)
Neutrophils Relative %: 42 %
Platelets: 244 10*3/uL (ref 150–400)
RBC: 3.27 MIL/uL — ABNORMAL LOW (ref 4.22–5.81)
RDW: 17.2 % — ABNORMAL HIGH (ref 11.5–15.5)
WBC: 6.5 10*3/uL (ref 4.0–10.5)
nRBC: 0 % (ref 0.0–0.2)

## 2020-08-26 MED ORDER — SODIUM CHLORIDE 0.9% FLUSH
10.0000 mL | INTRAVENOUS | Status: DC | PRN
  Administered 2020-08-26: 10 mL via INTRAVENOUS
  Filled 2020-08-26: qty 10

## 2020-08-26 MED ORDER — MIRTAZAPINE 30 MG PO TABS: 30.0000 mg | ORAL_TABLET | Freq: Every day | ORAL | 2 refills | Status: DC

## 2020-08-26 MED ORDER — HEPARIN SOD (PORK) LOCK FLUSH 100 UNIT/ML IV SOLN
500.0000 [IU] | Freq: Once | INTRAVENOUS | Status: AC
  Administered 2020-08-26: 500 [IU] via INTRAVENOUS
  Filled 2020-08-26: qty 5

## 2020-08-26 NOTE — Progress Notes (Signed)
Patient here today for follow up, MRI results. Patient reports weakness today. Patient also reports intermittent abdominal pain-reports pain is improved with oxycodone.

## 2020-08-26 NOTE — Progress Notes (Signed)
[place]  [phone] [phone]   Name: Joshua Ramos Date: 08/26/2020 MRN: [id number]  DOB: 07/05/1951  Patient Care Team: Romualdo Bolk, FNP as PCP - General (Nurse Practitioner) Clent Jacks, RN as Oncology Nurse Navigator    REASON FOR CONSULTATION: Joshua Ramos is a 70 y.o. male with multiple medical problems including history of CVA, history of viral hepatitis, and stage IIIa hepatocellular carcinoma.  Plan was to initiate treatment with ablation but rising bilirubin excluded that option.  Abdominal MRI on 08/21/2020 revealed interval worsening of multifocal hepatocellular carcinoma with increased volume of ascites.  Patient has been symptomatic with declining performance status.  He was referred to palliative care to help address goals and manage ongoing symptoms.  SOCIAL HISTORY:     reports that he has quit smoking. His smoking use included cigarettes. He has a 20.00 pack-year smoking history. He has never used smokeless tobacco. He reports previous alcohol use. He reports previous drug use.   Patient is unmarried.  He has no children.  He has a sister and brother who are involved in his care.  ADVANCE DIRECTIVES:  Not on file  CODE STATUS:  DNR/DNI (DNR form signed on 08/26/2020)  PAST MEDICAL HISTORY: Past Medical History:  Diagnosis Date  . Cancer (HCC)    liver/LUNG  . Headache    almost completely unbearable.  only time improves this  . Hepatitis A 04/2020   C+  . Hypertension   . Stroke [facility]) 2010   right sided weakness remains    PAST SURGICAL HISTORY:  Past Surgical History:  Procedure Laterality Date  . ARTERY BIOPSY Right 06/13/2020   Procedure: BIOPSY TEMPORAL ARTERY;  Surgeon: Algernon Huxley, MD;  Location: [facility] ORS;  Service: Vascular;  Laterality: Right;  . IR IMAGING GUIDED PORT INSERTION  05/02/2020  . IR RADIOLOGIST EVAL & MGMT  07/03/2020     HEMATOLOGY/ONCOLOGY HISTORY:  Oncology History  Hepatocellular carcinoma ([place])  04/18/2020 Initial Diagnosis   Hepatocellular carcinoma ([place])   05/01/2020 Cancer Staging   Staging form: Liver, AJCC 8th Edition - Clinical stage from 05/01/2020: Stage IIIA (cT3, cN0, cM0) - Signed by Lloyd Huger, MD on 05/01/2020   05/07/2020 -  Chemotherapy    Patient is on Treatment Plan: [place] + BEVACIZUMAB Q21D        ALLERGIES:  has No Known Allergies.  MEDICATIONS:  Current Outpatient Medications  Medication Sig Dispense Refill  . buPROPion (WELLBUTRIN SR) 150 MG 12 hr tablet Take 1 tablet by mouth in the morning and at bedtime.    . capsicum (ZOSTRIX) 0.075 % topical cream Apply 1 application topically every 8 (eight) hours.     . lidocaine-prilocaine (EMLA) cream Apply to affected area once 30 g 3  . lisinopril-hydrochlorothiazide (ZESTORETIC) 20-12.5 MG tablet Take 1 tablet by mouth daily.    . mirtazapine (REMERON) 15 MG tablet Take 1 tablet (15 mg total) by mouth at bedtime. 30 tablet 1  . Oxycodone HCl 10 MG TABS Take 1 tablet (10 mg total) by mouth 4 (four) times daily as needed. 90 tablet 0  . prochlorperazine (COMPAZINE) 10 MG tablet Take 1 tablet (10 mg total) by mouth every 6 (six) hours as needed (Nausea or vomiting). 60 tablet 2   No current facility-administered medications for this visit.   Facility-Administered Medications Ordered in Other Visits  Medication Dose Route Frequency Provider Last Rate Last Admin  . heparin lock  flush 100 unit/mL  500 Units Intravenous Once Lloyd Huger, MD      . sodium chloride flush (NS) 0.9 % injection 10 mL  10 mL Intravenous PRN Lloyd Huger, MD   10 mL at 08/26/20 1018    VITAL SIGNS: There were no vitals taken for this visit. There were no vitals filed for this visit.  Estimated body mass index is 20.42 kg/m as calculated from the following:   Height as of 07/08/20: _0  (1.854 m).   Weight as of  08/13/20: 154 lb 12.8 oz (70.2 kg).  LABS: CBC:    Component Value Date/Time   WBC 6.5 08/26/2020 0954   HGB 11.2 (L) 08/26/2020 0954   HGB 14.7 05/08/2014 1620   HCT 32.3 (L) 08/26/2020 0954   HCT 45.2 05/08/2014 1620   PLT 244 08/26/2020 0954   PLT 119 (L) 05/08/2014 1620   MCV 98.8 08/26/2020 0954   MCV 99 05/08/2014 1620   NEUTROABS 2.8 08/26/2020 0954   NEUTROABS 4.8 05/08/2014 1620   LYMPHSABS 2.5 08/26/2020 0954   LYMPHSABS 1.8 05/08/2014 1620   MONOABS 0.7 08/26/2020 0954   MONOABS 0.5 05/08/2014 1620   EOSABS 0.5 08/26/2020 0954   EOSABS 0.1 05/08/2014 1620   BASOSABS 0.1 08/26/2020 0954   BASOSABS 0.1 05/08/2014 1620   Comprehensive Metabolic Panel:    Component Value Date/Time   NA 137 08/18/2020 0829   NA 136 05/08/2014 1620   K 4.0 08/18/2020 0829   K 4.0 05/08/2014 1620   CL 107 08/18/2020 0829   CL 104 05/08/2014 1620   CO2 23 08/18/2020 0829   CO2 25 05/08/2014 1620   BUN 22 08/18/2020 0829   BUN 14 05/08/2014 1620   CREATININE 1.05 08/18/2020 0829   CREATININE 1.12 05/08/2014 1620   GLUCOSE 97 08/18/2020 0829   GLUCOSE 89 05/08/2014 1620   CALCIUM 8.7 (L) 08/18/2020 0829   CALCIUM 8.4 (L) 05/08/2014 1620   AST 115 (H) 08/18/2020 0829   AST 116 (H) 05/08/2014 1620   ALT 42 08/18/2020 0829   ALT 137 (H) 05/08/2014 1620   ALKPHOS 187 (H) 08/18/2020 0829   ALKPHOS 91 05/08/2014 1620   BILITOT 2.9 (H) 08/18/2020 0829   BILITOT 1.3 (H) 05/08/2014 1620   PROT 6.7 08/18/2020 0829   PROT 8.2 05/08/2014 1620   ALBUMIN 1.8 (L) 08/18/2020 0829   ALBUMIN 3.2 (L) 05/08/2014 1620    RADIOGRAPHIC STUDIES: MR Abdomen W Wo Contrast  Result Date: 08/22/2020 CLINICAL DATA:  Elevated serum bilirubin and progressive weakness. Follow-up hepatocellular carcinoma. EXAM: MRI ABDOMEN WITHOUT AND WITH CONTRAST TECHNIQUE: Multiplanar multisequence MR imaging of the abdomen was performed both before and after the administration of intravenous contrast. CONTRAST:  94m  GADAVIST GADOBUTROL 1 MMOL/ML IV SOLN COMPARISON:  Abdominal MRI 07/12/2020 and abdominopelvic CT 04/01/2020. FINDINGS: Lower chest: No significant findings are seen within the visualized lower chest aside from probable small distal esophageal varices. No significant pleural effusion. Hepatobiliary: Again demonstrated are morphologic changes of cirrhosis with widespread hypervascular lesions consistent with multifocal hepatocellular carcinoma/metastatic disease. Interval subjective mild worsening compared with the prior study of 6 weeks ago. For example, a lesion in the dome of segment 4 measures 2.2 cm on image 16/11 (previously 2.1 cm). Large exophytic lesion projecting inferiorly from the left hepatic lobe measures 5.7 x 5.0 cm on image 26/5 (previously 5.3 x 4.7 cm). No evidence of portal or hepatic vein tumor thrombus. There is mildly progressive diffuse gallbladder wall thickening.  No significant intra or extrahepatic biliary dilatation. Pancreas: Unremarkable. No pancreatic ductal dilatation or surrounding inflammatory changes. Spleen: Normal in size without focal abnormality. Adrenals/Urinary Tract: Both adrenal glands appear normal. The kidneys appear stable without hydronephrosis or focal mass lesion. There is a small right renal cyst. Stomach/Bowel: The stomach appears unremarkable for its degree of distension. No evidence of bowel wall thickening, distention or surrounding inflammatory change. Vascular/Lymphatic: Mildly prominent lymph nodes in the gastrohepatic ligament are unchanged. No acute vascular findings are seen. As above, no evidence tumor thrombus in the portal or hepatic veins. Other: There is a large amount of ascites which has increased in volume compared with the prior study. No definite peritoneal nodularity to suggest metastatic disease. Fluid extends into a small umbilical hernia. Musculoskeletal: No acute or significant osseous findings. Lumbar spondylosis noted. IMPRESSION: 1.  Interval subjective mild worsening in the multifocal hepatocellular carcinoma compared with the prior study of 6 weeks ago. 2. Increased volume of ascites, without peritoneal nodularity. 3. Mildly progressive diffuse gallbladder wall thickening, likely related to liver disease and/or hypoalbuminemia. No evidence of biliary dilatation. Electronically Signed   By: Richardean Sale M.D.   On: 08/22/2020 07:44    PERFORMANCE STATUS (ECOG) : 2 - Symptomatic, <50% confined to bed  Review of Systems Unless otherwise noted, a complete review of systems is negative.  Physical Exam General: NAD Pulmonary: Unlabored Extremities: no edema, no joint deformities Skin: no rashes Neurological: Weakness but otherwise nonfocal  IMPRESSION: I met with patient, sister, and brother following their visit with Dr. Grayland Ormond.  Patient has progressively declining performance status and has not felt to be a viable candidate for systemic treatment.  Hospice was discussed at length and both patient and family were in agreement.  Patient is not interested in aggressive measures or resuscitation at end-of-life.  A DNR order was signed for him to take home.  Patient is living with his sister and [place]. Will send referral [facility] hospice [facility].   PLAN: -Best supportive care -Referral to [facility] in [place], [place] -DNR/DNI -RTC as needed  Case and plan discussed with Dr. Grayland Ormond  Patient expressed understanding and was in agreement with this plan. He also understands that He can call the clinic at any time with any questions, concerns, or complaints.     Time Total: 30 minutes  Visit consisted of counseling and education dealing with the complex and emotionally intense issues of symptom management and palliative care in the setting of serious and potentially life-threatening illness.Greater than 50%  of this time was spent counseling and coordinating care related to the above  assessment and plan.  Signed by: Altha Harm, PhD, NP-C

## 2020-08-26 NOTE — Telephone Encounter (Signed)
Entered in erro

## 2020-08-27 ENCOUNTER — Other Ambulatory Visit: Payer: Self-pay | Admitting: *Deleted

## 2020-08-27 DIAGNOSIS — C220 Liver cell carcinoma: Secondary | ICD-10-CM

## 2020-08-27 LAB — AFP TUMOR MARKER: AFP, Serum, Tumor Marker: 5.4 ng/mL (ref 0.0–8.3)

## 2020-09-02 ENCOUNTER — Other Ambulatory Visit (HOSPITAL_COMMUNITY): Payer: Medicare HMO

## 2020-09-02 ENCOUNTER — Ambulatory Visit (HOSPITAL_COMMUNITY): Payer: Medicare HMO

## 2020-09-03 ENCOUNTER — Ambulatory Visit: Payer: Medicare HMO

## 2020-09-03 ENCOUNTER — Other Ambulatory Visit: Payer: Medicare HMO

## 2020-09-03 ENCOUNTER — Ambulatory Visit: Payer: Medicare HMO | Admitting: Oncology

## 2020-09-17 ENCOUNTER — Other Ambulatory Visit: Payer: Self-pay | Admitting: Oncology

## 2020-09-30 ENCOUNTER — Other Ambulatory Visit (HOSPITAL_COMMUNITY): Payer: Medicare HMO

## 2020-09-30 ENCOUNTER — Ambulatory Visit (HOSPITAL_COMMUNITY): Payer: Medicare HMO

## 2020-10-24 DEATH — deceased

## 2022-05-24 ENCOUNTER — Encounter (INDEPENDENT_AMBULATORY_CARE_PROVIDER_SITE_OTHER): Payer: Self-pay
